# Patient Record
Sex: Male | Born: 1945 | Race: White | Hispanic: No | Smoking: Former smoker
Health system: Southern US, Community
[De-identification: ages and names within clinical notes are randomized; demographics above are authoritative.]

## PROBLEM LIST (undated history)

## (undated) DIAGNOSIS — T7840XA Allergy, unspecified, initial encounter: Secondary | ICD-10-CM

## (undated) DIAGNOSIS — I2699 Other pulmonary embolism without acute cor pulmonale: Secondary | ICD-10-CM

## (undated) DIAGNOSIS — I10 Essential (primary) hypertension: Secondary | ICD-10-CM

## (undated) DIAGNOSIS — G71039 Limb girdle muscular dystrophy, unspecified: Secondary | ICD-10-CM

## (undated) HISTORY — DX: Essential (primary) hypertension: I10

## (undated) HISTORY — DX: Limb girdle muscular dystrophy, unspecified: G71.039

## (undated) HISTORY — DX: Allergy, unspecified, initial encounter: T78.40XA

## (undated) HISTORY — DX: Other pulmonary embolism without acute cor pulmonale: I26.99

---

## 2019-11-12 ENCOUNTER — Ambulatory Visit: Payer: Medicare Other | Attending: Internal Medicine

## 2019-11-12 DIAGNOSIS — Z23 Encounter for immunization: Secondary | ICD-10-CM

## 2019-11-12 NOTE — Progress Notes (Signed)
   Covid-19 Vaccination Clinic  Name:  Mitchell Knapp    MRN: [id number] DOB: Oct 10, 1945  11/12/2019  Mr. [name] was observed post Covid-19 immunization for 15 minutes without incidence. He was provided with Vaccine Information Sheet and instruction to access the V-Safe system.   Mr. [name] was instructed to call 911 with any severe reactions post vaccine: Marland Kitchen Difficulty breathing  . Swelling of your face and throat  . A fast heartbeat  . A bad rash all over your body  . Dizziness and weakness    Immunizations Administered    Name Date Dose VIS Date Route   Pfizer COVID-19 Vaccine 11/12/2019  1:23 PM 0.3 mL 09/14/2019 Intramuscular   Manufacturer: [employer], [employer]   Lot: [id number]   NDC: [address]-7

## 2019-11-29 ENCOUNTER — Ambulatory Visit: Payer: Self-pay

## 2019-12-07 ENCOUNTER — Ambulatory Visit: Payer: Medicare Other | Attending: Internal Medicine

## 2019-12-07 DIAGNOSIS — Z23 Encounter for immunization: Secondary | ICD-10-CM | POA: Insufficient documentation

## 2019-12-07 NOTE — Progress Notes (Signed)
   Covid-19 Vaccination Clinic  Name:  Mitchell Knapp    MRN: [id number] DOB: 1946-08-30  12/07/2019  [name] was observed post Covid-19 immunization for 15 minutes without incident. He was provided with Vaccine Information Sheet and instruction to access the V-Safe system.   [name] was instructed to call 911 with any severe reactions post vaccine: Marland Kitchen Difficulty breathing  . Swelling of face and throat  . A fast heartbeat  . A bad rash all over body  . Dizziness and weakness   Immunizations Administered    Name Date Dose VIS Date Route   Pfizer COVID-19 Vaccine 12/07/2019  8:29 AM 0.3 mL 09/14/2019 Intramuscular   Manufacturer: [employer], [employer]   Lot: [id number]   NDC: [address]-7

## 2020-08-27 ENCOUNTER — Emergency Department (HOSPITAL_COMMUNITY): Payer: Medicare Other

## 2020-08-27 ENCOUNTER — Encounter (HOSPITAL_COMMUNITY): Payer: Self-pay | Admitting: Physician Assistant

## 2020-08-27 ENCOUNTER — Inpatient Hospital Stay (HOSPITAL_COMMUNITY)
Admission: EM | Admit: 2020-08-27 | Discharge: 2020-08-30 | DRG: 175 | Disposition: A | Discharge status: Home / Self Care | Payer: Medicare Other | Attending: Internal Medicine | Admitting: Internal Medicine

## 2020-08-27 ENCOUNTER — Other Ambulatory Visit: Payer: Self-pay

## 2020-08-27 DIAGNOSIS — Z87891 Personal history of nicotine dependence: Secondary | ICD-10-CM

## 2020-08-27 DIAGNOSIS — I959 Hypotension, unspecified: Secondary | ICD-10-CM | POA: Diagnosis present

## 2020-08-27 DIAGNOSIS — I2694 Multiple subsegmental pulmonary emboli without acute cor pulmonale: Secondary | ICD-10-CM

## 2020-08-27 DIAGNOSIS — I10 Essential (primary) hypertension: Secondary | ICD-10-CM

## 2020-08-27 DIAGNOSIS — Z20822 Contact with and (suspected) exposure to covid-19: Secondary | ICD-10-CM | POA: Diagnosis present

## 2020-08-27 DIAGNOSIS — N400 Enlarged prostate without lower urinary tract symptoms: Secondary | ICD-10-CM | POA: Diagnosis present

## 2020-08-27 DIAGNOSIS — E872 Acidosis: Secondary | ICD-10-CM | POA: Diagnosis present

## 2020-08-27 DIAGNOSIS — D72829 Elevated white blood cell count, unspecified: Secondary | ICD-10-CM | POA: Diagnosis present

## 2020-08-27 DIAGNOSIS — I2602 Saddle embolus of pulmonary artery with acute cor pulmonale: Secondary | ICD-10-CM | POA: Diagnosis not present

## 2020-08-27 DIAGNOSIS — I2601 Septic pulmonary embolism with acute cor pulmonale: Secondary | ICD-10-CM | POA: Diagnosis not present

## 2020-08-27 DIAGNOSIS — Z23 Encounter for immunization: Secondary | ICD-10-CM

## 2020-08-27 DIAGNOSIS — J9601 Acute respiratory failure with hypoxia: Secondary | ICD-10-CM | POA: Diagnosis present

## 2020-08-27 DIAGNOSIS — G7109 Other specified muscular dystrophies: Secondary | ICD-10-CM | POA: Diagnosis present

## 2020-08-27 DIAGNOSIS — F32A Depression, unspecified: Secondary | ICD-10-CM | POA: Diagnosis present

## 2020-08-27 DIAGNOSIS — I2699 Other pulmonary embolism without acute cor pulmonale: Secondary | ICD-10-CM | POA: Diagnosis not present

## 2020-08-27 DIAGNOSIS — E860 Dehydration: Secondary | ICD-10-CM | POA: Diagnosis present

## 2020-08-27 DIAGNOSIS — G71039 Limb girdle muscular dystrophy, unspecified: Secondary | ICD-10-CM

## 2020-08-27 DIAGNOSIS — E876 Hypokalemia: Secondary | ICD-10-CM | POA: Diagnosis present

## 2020-08-27 LAB — URINALYSIS, ROUTINE W REFLEX MICROSCOPIC
Bilirubin Urine: NEGATIVE
Glucose, UA: NEGATIVE mg/dL
Ketones, ur: 5 mg/dL — AB
Nitrite: NEGATIVE
Protein, ur: 30 mg/dL — AB
Specific Gravity, Urine: 1.015 (ref 1.005–1.030)
WBC, UA: >50 WBC/hpf — ABNORMAL HIGH (ref 0–5)
pH: 5 (ref 5.0–8.0)

## 2020-08-27 LAB — COMPREHENSIVE METABOLIC PANEL
ALT: 31 U/L (ref 0–44)
AST: 27 U/L (ref 15–41)
Albumin: 2.7 g/dL — ABNORMAL LOW (ref 3.5–5.0)
Alkaline Phosphatase: 48 U/L (ref 38–126)
Anion gap: 16 — ABNORMAL HIGH (ref 5–15)
BUN: 23 mg/dL (ref 8–23)
CO2: 24 mmol/L (ref 22–32)
Calcium: 8.7 mg/dL — ABNORMAL LOW (ref 8.9–10.3)
Chloride: 102 mmol/L (ref 98–111)
Creatinine, Ser: 0.84 mg/dL (ref 0.61–1.24)
GFR, Estimated: >60 mL/min (ref >60)
Glucose, Bld: 164 mg/dL — ABNORMAL HIGH (ref 70–99)
Potassium: 2.2 mmol/L — CL (ref 3.5–5.1)
Sodium: 142 mmol/L (ref 135–145)
Total Bilirubin: 2.2 mg/dL — ABNORMAL HIGH (ref 0.3–1.2)
Total Protein: 6.5 g/dL (ref 6.5–8.1)

## 2020-08-27 LAB — CBC WITH DIFFERENTIAL/PLATELET
Abs Immature Granulocytes: 0.44 10*3/uL — ABNORMAL HIGH (ref 0.00–0.07)
Basophils Absolute: 0.1 10*3/uL (ref 0.0–0.1)
Basophils Relative: 1 %
Eosinophils Absolute: 0 10*3/uL (ref 0.0–0.5)
Eosinophils Relative: 0 %
HCT: 41.9 % (ref 39.0–52.0)
Hemoglobin: 14.4 g/dL (ref 13.0–17.0)
Immature Granulocytes: 2 %
Lymphocytes Relative: 6 %
Lymphs Abs: 1.1 10*3/uL (ref 0.7–4.0)
MCH: 32.4 pg (ref 26.0–34.0)
MCHC: 34.4 g/dL (ref 30.0–36.0)
MCV: 94.4 fL (ref 80.0–100.0)
Monocytes Absolute: 1.1 10*3/uL — ABNORMAL HIGH (ref 0.1–1.0)
Monocytes Relative: 5 %
Neutro Abs: 17.4 10*3/uL — ABNORMAL HIGH (ref 1.7–7.7)
Neutrophils Relative %: 86 %
Platelets: 225 10*3/uL (ref 150–400)
RBC: 4.44 MIL/uL (ref 4.22–5.81)
RDW: 14.4 % (ref 11.5–15.5)
WBC: 20.2 10*3/uL — ABNORMAL HIGH (ref 4.0–10.5)
nRBC: 0 % (ref 0.0–0.2)

## 2020-08-27 LAB — CK: Total CK: 80 U/L (ref 49–397)

## 2020-08-27 LAB — MAGNESIUM: Magnesium: 1.6 mg/dL — ABNORMAL LOW (ref 1.7–2.4)

## 2020-08-27 LAB — LACTIC ACID, PLASMA
Lactic Acid, Venous: 2.4 mmol/L (ref 0.5–1.9)
Lactic Acid, Venous: 2.7 mmol/L (ref 0.5–1.9)
Lactic Acid, Venous: 1.6 mmol/L (ref 0.5–1.9)

## 2020-08-27 LAB — BASIC METABOLIC PANEL
Anion gap: 14 (ref 5–15)
BUN: 23 mg/dL (ref 8–23)
CO2: 23 mmol/L (ref 22–32)
Calcium: 8.5 mg/dL — ABNORMAL LOW (ref 8.9–10.3)
Chloride: 101 mmol/L (ref 98–111)
Creatinine, Ser: 0.66 mg/dL (ref 0.61–1.24)
GFR, Estimated: >60 mL/min (ref >60)
Glucose, Bld: 101 mg/dL — ABNORMAL HIGH (ref 70–99)
Potassium: 3.4 mmol/L — ABNORMAL LOW (ref 3.5–5.1)
Sodium: 138 mmol/L (ref 135–145)

## 2020-08-27 LAB — PROTIME-INR
INR: 1.2 (ref 0.8–1.2)
Prothrombin Time: 14.9 seconds (ref 11.4–15.2)

## 2020-08-27 LAB — BRAIN NATRIURETIC PEPTIDE: B Natriuretic Peptide: 799.5 pg/mL — ABNORMAL HIGH (ref 0.0–100.0)

## 2020-08-27 LAB — TROPONIN I (HIGH SENSITIVITY)
Troponin I (High Sensitivity): 228 ng/L (ref <18)
Troponin I (High Sensitivity): 180 ng/L (ref <18)

## 2020-08-27 LAB — GLUCOSE, CAPILLARY: Glucose-Capillary: 90 mg/dL (ref 70–99)

## 2020-08-27 LAB — RESP PANEL BY RT-PCR (FLU A&B, COVID) ARPGX2
Influenza A by PCR: NEGATIVE
Influenza B by PCR: NEGATIVE
SARS Coronavirus 2 by RT PCR: NEGATIVE

## 2020-08-27 LAB — PROCALCITONIN: Procalcitonin: <0.1 ng/mL

## 2020-08-27 LAB — APTT: aPTT: 30 seconds (ref 24–36)

## 2020-08-27 MED ORDER — IOHEXOL 350 MG/ML SOLN
75.0000 mL | Freq: Once | INTRAVENOUS | Status: AC | PRN
  Administered 2020-08-27: 75 mL via INTRAVENOUS

## 2020-08-27 MED ORDER — CHLORHEXIDINE GLUCONATE CLOTH 2 % EX PADS
6.0000 | MEDICATED_PAD | Freq: Every day | CUTANEOUS | Status: DC
  Administered 2020-08-28 – 2020-08-29 (×2): 6 via TOPICAL

## 2020-08-27 MED ORDER — POLYETHYLENE GLYCOL 3350 17 G PO PACK: 17.0000 g | PACK | Freq: Every day | ORAL | Status: DC | PRN

## 2020-08-27 MED ORDER — VANCOMYCIN HCL IN DEXTROSE 1-5 GM/200ML-% IV SOLN: 1000.0000 mg | Freq: Once | INTRAVENOUS | Status: DC

## 2020-08-27 MED ORDER — INFLUENZA VAC A&B SA ADJ QUAD 0.5 ML IM PRSY
0.5000 mL | PREFILLED_SYRINGE | INTRAMUSCULAR | Status: AC
  Administered 2020-08-28: 0.5 mL via INTRAMUSCULAR
  Filled 2020-08-27: qty 0.5

## 2020-08-27 MED ORDER — LACTATED RINGERS IV BOLUS (SEPSIS)
1000.0000 mL | Freq: Once | INTRAVENOUS | Status: AC
  Administered 2020-08-27: 1000 mL via INTRAVENOUS

## 2020-08-27 MED ORDER — MAGNESIUM SULFATE 2 GM/50ML IV SOLN
2.0000 g | Freq: Once | INTRAVENOUS | Status: AC
  Administered 2020-08-27: 2 g via INTRAVENOUS
  Filled 2020-08-27: qty 50

## 2020-08-27 MED ORDER — HEPARIN (PORCINE) 25000 UT/250ML-% IV SOLN
1600.0000 [IU]/h | INTRAVENOUS | Status: AC
  Administered 2020-08-27 – 2020-08-28 (×3): 1600 [IU]/h via INTRAVENOUS
  Filled 2020-08-27 (×3): qty 250

## 2020-08-27 MED ORDER — METRONIDAZOLE IN NACL 5-0.79 MG/ML-% IV SOLN
500.0000 mg | Freq: Once | INTRAVENOUS | Status: AC
  Administered 2020-08-27: 500 mg via INTRAVENOUS
  Filled 2020-08-27: qty 100

## 2020-08-27 MED ORDER — POTASSIUM CHLORIDE CRYS ER 20 MEQ PO TBCR
40.0000 meq | EXTENDED_RELEASE_TABLET | Freq: Once | ORAL | Status: AC
  Administered 2020-08-27: 40 meq via ORAL
  Filled 2020-08-27: qty 2

## 2020-08-27 MED ORDER — DOCUSATE SODIUM 100 MG PO CAPS: 100.0000 mg | ORAL_CAPSULE | Freq: Two times a day (BID) | ORAL | Status: DC | PRN

## 2020-08-27 MED ORDER — VANCOMYCIN HCL IN DEXTROSE 1-5 GM/200ML-% IV SOLN: 1000.0000 mg | Freq: Two times a day (BID) | INTRAVENOUS | Status: DC

## 2020-08-27 MED ORDER — HEPARIN BOLUS VIA INFUSION
5500.0000 [IU] | Freq: Once | INTRAVENOUS | Status: AC
  Administered 2020-08-27: 5500 [IU] via INTRAVENOUS
  Filled 2020-08-27: qty 5500

## 2020-08-27 MED ORDER — VANCOMYCIN HCL 2000 MG/400ML IV SOLN
2000.0000 mg | Freq: Once | INTRAVENOUS | Status: AC
  Administered 2020-08-27: 2000 mg via INTRAVENOUS
  Filled 2020-08-27: qty 400

## 2020-08-27 MED ORDER — SODIUM CHLORIDE 0.9 % IV SOLN
2.0000 g | Freq: Once | INTRAVENOUS | Status: AC
  Administered 2020-08-27: 2 g via INTRAVENOUS
  Filled 2020-08-27: qty 2

## 2020-08-27 MED ORDER — LACTATED RINGERS IV SOLN: INTRAVENOUS | Status: DC

## 2020-08-27 MED ORDER — POTASSIUM CHLORIDE 10 MEQ/100ML IV SOLN
10.0000 meq | INTRAVENOUS | Status: AC
  Administered 2020-08-27 – 2020-08-28 (×5): 10 meq via INTRAVENOUS
  Filled 2020-08-27 (×3): qty 100

## 2020-08-27 MED ORDER — SODIUM CHLORIDE 0.9 % IV BOLUS
1000.0000 mL | Freq: Once | INTRAVENOUS | Status: AC
  Administered 2020-08-27: 1000 mL via INTRAVENOUS

## 2020-08-27 NOTE — ED Notes (Signed)
Date and time results received: 08/27/20 1535 (use smartphrase ".now" to insert current time)  Test: la Critical Value: 2.4  Name of Provider Notified: fondaW  Orders Received? Or Actions Taken?:

## 2020-08-27 NOTE — Progress Notes (Signed)
Critical care attending attestation note:  Patient seen and examined and relevant ancillary tests reviewed.  I agree with the assessment and plan of care as outlined by Jule Ser, NP. This patient was seen as a shared visit.   Synopsis of assessment and plan:  74 year old man with limb girdle MD. Prolonged car trip.  Pre-syncope at home. Hypotension improved with fluids.  Now normotensive and not tachycardic  Still dyspnea when speaking.  On examination: mild respiratory distress. Chest clear. No JVD, no P2 no right sided gallop. No palpable cords, no calf tenderness.   Personally reviewed CT which showed significant central clot burden.  POC Echo confirms RV dilatation.  Assessment:   Submassive PE with RV dysfunction. No contraindications to lytics if shock develops Consider lytics if lactate fails to clear.    Lynnell Catalan, MD [facility] ICU Physician [facility]  Pager: [phone] Mobile: [phone] After hours: [phone].  08/27/2020, 6:58 PM

## 2020-08-27 NOTE — H&P (Signed)
NAME:  Mitchell Knapp, MRN:  [id number], DOB:  1946-04-10, LOS: 0 ADMISSION DATE:  08/27/2020, CONSULTATION DATE:  08/27/2020 REFERRING MD:  Solon Augusta, PA, CHIEF COMPLAINT:  SOB  Brief History   74 year old male with recent long travel presenting with progressive 1 week hx of SOB, acutely worsened this morning, and was near syncopal.  Initially hypotensive and hypoxic found to have acute PE with evidence of right heart strain.  Hypotension improved with IVF.  PCCM to admit to ICU overnight for close observation.    History of present illness   74 year old male with history of limb-girdle muscular dystrophy, HTN, anxiety and depression presenting with progressive one week of shortness of breath which became severe this morning and was near syncopal.   Reports he recently traveled from [place] and drove.  Since has felt weak and had progressive shortness of breath.  This morning, he states it became severe and was near syncopal.  Denies any lower leg pain/ swelling, fever, N/V, diarrhea, recent surgeries or trauma, or bleeding episodes (except for occasional blood streaks when constipated). Up todate on cancer screenings.   Found by EMS hypoxic 81% on room air, tachycardic, tachypneic, and hypotensive with SBP in the 70's.  In ER, he was afebrile. Labs noted for K 2.2, AG 16, Mag 1.6, BNP 799, troponin hs 228-> 180, CK 80, WBC 20.2, normal coags, negative SARS2/ flu, CXR with no acute process, borderline cardiomegaly.  He was empirically started on empiric abx for concern of sepsis.  CTA PE showed extensive bilateral Pes with evidence of right heart strain.   BP improved after 3L LR. He was started on heparin gtt and PCCM called for consult/ possible admit to ICU.   Past Medical History  Limb-girdle muscular dystrophy, HTN, depression, anxiety, former smoker  Significant Hospital Events   11/24 Admit  Consults:   Procedures:   Significant Diagnostic Tests:  11/24 CTA PE >> 1.  Positive for extensive bilateral pulmonary emboli with CT evidence of right heart strain. No saddle embolus at this time. 2. Minimal nonspecific upper lobe pulmonary ground-glass opacity, might be early infarction. Trace left pleural effusion. 3. Calcified coronary artery and Aortic Atherosclerosis  Micro Data:  11/24 SARS 2/ flu>> neg 11/24 BC >> 11/24 UC >>  Antimicrobials:  11/24 vanc  11/24 cefepime  11/24 flagyl  Interim history/subjective:  Remains on 4L [place]  Objective   Blood pressure 112/85, pulse 91, temperature 100 F (37.8 C), temperature source Rectal, resp. rate (!) 23, height 6\' 4"  (1.93 m), weight 99.8 kg, SpO2 100 %.       No intake or output data in the 24 hours ending 08/27/20 1828 Filed Weights   08/27/20 1415  Weight: 99.8 kg   Examination: Per Dr. 08/29/20  [facility] Problem list    Assessment & Plan:   Provoked submassive PE, extensive with evidence of right heart strain Hypoxic respiratory failure secondary to above- hypotensive and near syncopal today  P:  Admit to ICU for close monitoring Continue heparin gtt.  No contraindications for systemic lytics if decompensates, patient already consented TTE in am  Bilateral LE dopplers to assess for DVT Goal MAP > 65 Continue supplemental O2 for sat goal > 92% Trending lactic acid  troponin trend flat   Hypokalemia hypomagnesemia  P:  K replete ordered Mag 2gm now  BMET after K replete completed    Leukocytosis  P:  Check PCT  Follow culture data Trend WBC/  Fever curve  Hold on further empiric abx  Best practice (evaluated daily)   Diet: NPO Pain/Anxiety/Delirium protocol (if indicated): n/a VAP protocol (if indicated): n/a DVT prophylaxis: heparin gtt GI prophylaxis: n/a Glucose control: trend on bmet Mobility: PT/OT after 24- 48hrs on heparin  Code Status: Full, confirmed with patient  Disposition: admit to ICU   Labs   CBC: Recent Labs  Lab 08/27/20 1452  WBC  20.2*  NEUTROABS 17.4*  HGB 14.4  HCT 41.9  MCV 94.4  PLT 225    Basic Metabolic Panel: Recent Labs  Lab 08/27/20 1452 08/27/20 1645  NA 142  --   K 2.2*  --   CL 102  --   CO2 24  --   GLUCOSE 164*  --   BUN 23  --   CREATININE 0.84  --   CALCIUM 8.7*  --   MG  --  1.6*   GFR: Estimated Creatinine Clearance: 96.2 mL/min (by C-G formula based on SCr of 0.84 mg/dL). Recent Labs  Lab 08/27/20 1452  WBC 20.2*  LATICACIDVEN 2.4*    Liver Function Tests: Recent Labs  Lab 08/27/20 1452  AST 27  ALT 31  ALKPHOS 48  BILITOT 2.2*  PROT 6.5  ALBUMIN 2.7*   No results for input(s): LIPASE, AMYLASE in the last 168 hours. No results for input(s): AMMONIA in the last 168 hours.  ABG No results found for: PHART, PCO2ART, PO2ART, HCO3, TCO2, ACIDBASEDEF, O2SAT   Coagulation Profile: Recent Labs  Lab 08/27/20 1524  INR 1.2    Cardiac Enzymes: Recent Labs  Lab 08/27/20 1452  CKTOTAL 80    HbA1C: No results found for: HGBA1C  CBG: No results for input(s): GLUCAP in the last 168 hours.  Review of Systems:   Review of Systems  Constitutional: Negative for fever.  Respiratory: Positive for cough and shortness of breath. Negative for hemoptysis, sputum production and wheezing.   Cardiovascular: Negative for chest pain and leg swelling.  Gastrointestinal: Negative for blood in stool, constipation, diarrhea, nausea and vomiting.  Musculoskeletal: Negative for falls.  Neurological: Negative for focal weakness and loss of consciousness.   Past Medical History  He,  has no past medical history on file.   Surgical History   History reviewed. No pertinent surgical history.   Social History      Family History   His family history is not on file.   Allergies Allergies  Allergen Reactions  . Lisinopril Swelling     Home Medications  Prior to Admission medications   Medication Sig Start Date End Date Taking? Authorizing Provider  amLODipine (NORVASC)  5 MG tablet Take 5 mg by mouth daily. 05/29/20  Yes [provider]  buPROPion (WELLBUTRIN XL) 300 MG 24 hr tablet Take 300 mg by mouth daily. 08/03/20  Yes [provider]  COMBIGAN 0.2-0.5 % ophthalmic solution Place 1 drop into both eyes 2 (two) times daily.  07/11/20  Yes [provider]  Ibuprofen-Acetaminophen (ADVIL DUAL ACTION) 125-250 MG TABS Take 2-3 tablets by mouth every 8 (eight) hours as needed (headache, pain).   Yes [provider]  loratadine (CLARITIN) 10 MG tablet Take 10 mg by mouth daily as needed for allergies.   Yes [provider]  LORazepam (ATIVAN) 1 MG tablet Take 1-1.5 mg by mouth daily as needed for anxiety.  05/29/20  Yes [provider]  LUMIGAN 0.01 % SOLN Place 1 drop into both eyes at bedtime. 07/19/20  Yes [provider]  tamsulosin (FLOMAX) 0.4 MG CAPS capsule Take 0.8 mg by mouth daily. 03/28/20  Yes [provider]     Critical care time: 40 mins     Posey Boyer, ACNP [facility] 08/27/2020, 6:28 PM  See Amion for personal pager PCCM on call pager [phone]

## 2020-08-27 NOTE — ED Provider Notes (Addendum)
[facility] EMERGENCY DEPARTMENT Provider Note   CSN: [id number] Arrival date & time: 08/27/20  1357     History Chief Complaint  Patient presents with  . Fatigue    Mitchell Knapp is a 74 y.o. male.  HPI   Pt is a 74 y/o male with a h/o muscular dystrophy, HTN, who presents to the ED today for eval of shortness of breath that started a few days ago. Reports cough, nasal congestion. He has tried claritin which has improved symptoms somewhat. Denies chest pain. Denies leg pain/swelling, hemoptysis, recent surgery, hormone use, personal hx of cancer, or hx of DVT/PE.   States he slid out of bed about 5 days ago and was on the ground for 8 hours because he could not get up off the ground. Denies head trauma or injuries from this fall.   Pt states he just moved her from [place] 2 weeks ago. He drove here from there and did not stop over night on the way. The drive was 13 hours. Pt has been vaccinated against COVID.   History reviewed. No pertinent past medical history.  There are no problems to display for this patient.   History reviewed. No pertinent surgical history.     History reviewed. No pertinent family history.  Social History   Tobacco Use  . Smoking status: Not on file  Substance Use Topics  . Alcohol use: Not on file  . Drug use: Not on file    Home Medications Prior to Admission medications   Not on File    Allergies    Lisinopril  Review of Systems   Review of Systems  Constitutional: Positive for chills and fatigue. Negative for fever.  HENT: Positive for congestion. Negative for ear pain.   Eyes: Negative for pain and visual disturbance.  Respiratory: Positive for cough and shortness of breath.   Cardiovascular: Negative for chest pain and leg swelling.  Gastrointestinal: Negative for abdominal pain, constipation, diarrhea, nausea and vomiting.  Genitourinary: Negative for dysuria and hematuria.  Musculoskeletal:  Negative for back pain.  Skin: Negative for rash.  Neurological: Positive for weakness (generalized). Negative for headaches.  All other systems reviewed and are negative.   Physical Exam Updated Vital Signs BP 95/77   Pulse 92   Temp 100 F (37.8 C) (Rectal)   Resp 17   Ht 6\' 4"  (1.93 m)   Wt 99.8 kg   SpO2 96%   BMI 26.78 kg/m   Physical Exam Vitals and nursing note reviewed.  Constitutional:      Appearance: He is well-developed. He is ill-appearing.  HENT:     Head: Normocephalic and atraumatic.     Mouth/Throat:     Mouth: Mucous membranes are dry.  Eyes:     Conjunctiva/sclera: Conjunctivae normal.  Cardiovascular:     Rate and Rhythm: Normal rate and regular rhythm.     Heart sounds: Normal heart sounds. No murmur heard.   Pulmonary:     Effort: No respiratory distress.     Breath sounds: Rales (bibasilar) present. No wheezing or rhonchi.     Comments: Conversationally dyspneic Abdominal:     General: Bowel sounds are normal.     Palpations: Abdomen is soft.     Tenderness: There is no abdominal tenderness. There is no guarding or rebound.  Musculoskeletal:     Cervical back: Neck supple.  Skin:    General: Skin is warm and dry.  Neurological:     Mental  Status: He is alert.     ED Results / Procedures / Treatments   Labs (all labs ordered are listed, but only abnormal results are displayed) Labs Reviewed  CBC WITH DIFFERENTIAL/PLATELET - Abnormal; Notable for the following components:      Result Value   WBC 20.2 (*)    Neutro Abs 17.4 (*)    Monocytes Absolute 1.1 (*)    Abs Immature Granulocytes 0.44 (*)    All other components within normal limits  CULTURE, BLOOD (ROUTINE X 2)  CULTURE, BLOOD (ROUTINE X 2)  RESP PANEL BY RT-PCR (FLU A&B, COVID) ARPGX2  URINE CULTURE  COMPREHENSIVE METABOLIC PANEL  LACTIC ACID, PLASMA  URINALYSIS, ROUTINE W REFLEX MICROSCOPIC  CK  PROTIME-INR  APTT  TROPONIN I (HIGH SENSITIVITY)    EKG EKG  Interpretation  Date/Time:  Wednesday August 27 2020 14:09:49 EST Ventricular Rate:  102 PR Interval:    QRS Duration: 126 QT Interval:  361 QTC Calculation: 471 R Axis:   -63 Text Interpretation: Sinus tachycardia Left bundle branch block No acute changes Nonspecific ST and T wave abnormality Confirmed by Derwood Kaplan ([id number]) on 08/27/2020 2:49:03 PM   Radiology DG Chest Portable 1 View  Result Date: 08/27/2020 CLINICAL DATA:  74 year old male with cough. EXAM: PORTABLE CHEST 1 VIEW COMPARISON:  None. FINDINGS: Minimal left lung base atelectasis/scarring. No focal consolidation, pleural effusion or pneumothorax. Borderline cardiomegaly. Bilateral hilar prominence, likely related to pulmonary hypertension. There is mild atherosclerotic calcification of the aorta. No acute osseous pathology. IMPRESSION: 1. No acute cardiopulmonary process. 2. Borderline cardiomegaly. Electronically Signed   By: Elgie Collard M.D.   On: 08/27/2020 15:30    Procedures Procedures (including critical care time)  CRITICAL CARE Performed by: Karrie Meres   Total critical care time: 35 minutes  Critical care time was exclusive of separately billable procedures and treating other patients.  Critical care was necessary to treat or prevent imminent or life-threatening deterioration.  Critical care was time spent personally by me on the following activities: development of treatment plan with patient and/or surrogate as well as nursing, discussions with consultants, evaluation of patient's response to treatment, examination of patient, obtaining history from patient or surrogate, ordering and performing treatments and interventions, ordering and review of laboratory studies, ordering and review of radiographic studies, pulse oximetry and re-evaluation of patient's condition.   Medications Ordered in ED Medications  lactated ringers bolus 1,000 mL (has no administration in time range)    And    lactated ringers bolus 1,000 mL (has no administration in time range)  ceFEPIme (MAXIPIME) 2 g in sodium chloride 0.9 % 100 mL IVPB (has no administration in time range)  metroNIDAZOLE (FLAGYL) IVPB 500 mg (has no administration in time range)  vancomycin (VANCOREADY) IVPB 2000 mg/400 mL (has no administration in time range)  sodium chloride 0.9 % bolus 1,000 mL (1,000 mLs Intravenous New Bag/Given 08/27/20 1506)    ED Course  I have reviewed the triage vital signs and the nursing notes.  Pertinent labs & imaging results that were available during my care of the patient were reviewed by me and considered in my medical decision making (see chart for details).    MDM Rules/Calculators/A&P                          74 year old male presenting the emergency department today for evaluation of shortness of breath starting several days ago.  Just had a recent 13-hour  drive from [place] 2 weeks ago.  On arrival he is hypoxic and marginally tachycardic.  He is also intermittently hypotensive.  Have initiated work-up with labs, chest x-ray, CT PE protocol, EKG.  Will order IV fluids and obtain a rectal temperature.  Rectal temp was elevated and CBC resulted showing a white count of 20,000.   therefore code sepsis initiated with broad-spectrum antibiotics and the remainder of his 30 cc/kg fluid bolus.  At shift change, patient pending the remainder of his laboratory work and CTA chest. Care transitioned to [facility], PA-C. Plan is to f/u on pending w/u and admit patient for further treatment.    Final Clinical Impression(s) / ED Diagnoses Final diagnoses:  Acute respiratory failure with hypoxia [facility])    Rx / DC Orders ED Discharge Orders    None       Karrie Meres, PA-C 08/27/20 1502    Hlee Fringer S, PA-C 08/27/20 1531    Lalonnie Shaffer S, PA-C 08/27/20 1538    Derwood Kaplan, MD 09/05/20 1511

## 2020-08-27 NOTE — Sepsis Progress Note (Signed)
Notified bedside nurse of need to draw repeat lactic acid after completing fluid resuscitation.Marland Kitchen

## 2020-08-27 NOTE — Progress Notes (Signed)
ANTICOAGULATION CONSULT NOTE - Initial Consult  Pharmacy Consult for Heparin Indication: pulmonary embolus  Allergies  Allergen Reactions  . Lisinopril Swelling    Patient Measurements: Height: 6\' 4"  (193 cm) Weight: 99.8 kg (220 lb) IBW/kg (Calculated) : 86.8 Heparin Dosing Weight: 99.8 kg  Vital Signs: Temp: 100 F (37.8 C) (11/24 1500) Temp Source: Rectal (11/24 1500) BP: 103/86 (11/24 1615) Pulse Rate: 91 (11/24 1615)  Labs: Recent Labs    08/27/20 1452 08/27/20 1524  HGB 14.4  --   HCT 41.9  --   PLT 225  --   APTT  --  30  LABPROT  --  14.9  INR  --  1.2  CREATININE 0.84  --   CKTOTAL 80  --   TROPONINIHS 228*  --     Estimated Creatinine Clearance: 96.2 mL/min (by C-G formula based on SCr of 0.84 mg/dL).   Medical History: History reviewed. No pertinent past medical history.  Medications:  Infusions:  . heparin    . lactated ringers 1,000 mL (08/27/20 1650)  . metronidazole    . potassium chloride    . [START ON 08/28/2020] vancomycin    . vancomycin 2,000 mg (08/27/20 1649)    Assessment: 74 y.o. male admitted with fatigue found to have PE. Pharmacy has been consulted to start heparin. Hemoglobin and platelets wnl.    Goal of Therapy:  Heparin level 0.3-0.7 units/ml Monitor platelets by anticoagulation protocol: Yes   Plan:  Give 5500 units bolus x 1 Start heparin infusion at 1600 units/hr Check heparin level in 8 hours and daily while on heparin Continue to monitor H&H and platelets  66, PharmD PGY1 Pharmacy Resident 08/27/2020 4:55 PM  Please check [email or web address] for unit-specific pharmacy phone numbers.

## 2020-08-27 NOTE — Plan of Care (Signed)

## 2020-08-27 NOTE — Progress Notes (Signed)
eLink Physician-Brief Progress Note Patient Name: Mitchell Knapp DOB: July 16, 1946 MRN: [id number]   Date of Service  08/27/2020  HPI/Events of Note  78 F limm girdle muscle dystrophy, hypertension, anxiety and depression presented with shortness of breath and near syncopal episodes. Found to have bilateral PE with right heart strain  eICU Interventions  Submassive PE on heparin drip Plan for fibrinolysis if deteriorates     Intervention Category Evaluation Type: New Patient Evaluation  Darl Pikes 08/27/2020, 10:59 PM

## 2020-08-27 NOTE — Progress Notes (Signed)
Pharmacy Antibiotic Note  Mitchell Knapp is a 74 y.o. male admitted on 08/27/2020 with sepsis.  Pharmacy has been consulted for cefepime and vancomycin dosing.  Plan: Cefepime 2g IV q8h Vancomycin 2000 mg IV x1 followed by 1000 mg IV q12h Trough goal 15-20 mcg/mL Monitor renal function, cultures, clinical progress  Height: 6\' 4"  (193 cm) Weight: 99.8 kg (220 lb) IBW/kg (Calculated) : 86.8  Temp (24hrs), Avg:98.5 F (36.9 C), Min:97.6 F (36.4 C), Max:100 F (37.8 C)  Recent Labs  Lab 08/27/20 1452  WBC 20.2*    CrCl cannot be calculated (No successful lab value found.).    Allergies  Allergen Reactions  . Lisinopril Swelling    Antimicrobials this admission: Cefepime 11/24 >>  Vancomycin 11/24 >>  Flagyl 11/24 >>  Dose adjustments this admission:   Microbiology results: 11/24 BCx: sent   Thank you for allowing pharmacy to be a part of this patient's care.  12/24, PharmD PGY1 Pharmacy Resident 08/27/2020 3:33 PM  Please check [email or web address] for unit-specific pharmacy phone numbers.

## 2020-08-27 NOTE — ED Provider Notes (Signed)
Accepted handoff at shift change from [facility]. PA-C. Please see prior provider note for more detail.   Briefly: Patient is 74 y.o. male with history of limb-girdle muscular dystrophy denies any other significant past medical history apart from hypertension, depression, allergies and anxiety   DDX: concern for sepsis versus PE  Plan: Follow-up on blood work and CT scan and disposition appropriately suspect patient will be admission.  Physical Exam  BP 112/85   Pulse 91   Temp 100 F (37.8 C) (Rectal)   Resp (!) 23   Ht 6\' 4"  (1.93 m)   Wt 99.8 kg   SpO2 100%   BMI 26.78 kg/m   CONSTITUTIONAL:  fatigued, ill appearing 74 year old male NEURO:  Alert and oriented x 3, no focal deficits EYES:  pupils equal and reactive ENT/NECK:  trachea midline, no significant JVD CARDIO:  reg rate, reg rhythm, well-perfused PULM:  None labored breathing, clear to auscultation all fields. GI/GU:  Abdomen non-distended, soft, no tenderness to palpation. MSK/SPINE:  No gross deformities, no edema SKIN:  no rash obvious, atraumatic, no ecchymosis  PSYCH:  Appropriate speech and behavior  ED Course/Procedures     Results for orders placed or performed during the hospital encounter of 08/27/20  Resp Panel by RT-PCR (Flu A&B, Covid) Nasopharyngeal Swab   Specimen: Nasopharyngeal Swab; Nasopharyngeal(NP) swabs in vial transport medium  Result Value Ref Range   SARS Coronavirus 2 by RT PCR NEGATIVE NEGATIVE   Influenza A by PCR NEGATIVE NEGATIVE   Influenza B by PCR NEGATIVE NEGATIVE  CBC with Differential  Result Value Ref Range   WBC 20.2 (H) 4.0 - 10.5 K/uL   RBC 4.44 4.22 - 5.81 MIL/uL   Hemoglobin 14.4 13.0 - 17.0 g/dL   HCT 08/29/20 39 - 52 %   MCV 94.4 80.0 - 100.0 fL   MCH 32.4 26.0 - 34.0 pg   MCHC 34.4 30.0 - 36.0 g/dL   RDW 70.7 86.7 - 54.4 %   Platelets 225 150 - 400 K/uL   nRBC 0.0 0.0 - 0.2 %   Neutrophils Relative % 86 %   Neutro Abs 17.4 (H) 1.7 - 7.7 K/uL   Lymphocytes Relative 6  %   Lymphs Abs 1.1 0.7 - 4.0 K/uL   Monocytes Relative 5 %   Monocytes Absolute 1.1 (H) 0.1 - 1.0 K/uL   Eosinophils Relative 0 %   Eosinophils Absolute 0.0 0.0 - 0.5 K/uL   Basophils Relative 1 %   Basophils Absolute 0.1 0.0 - 0.1 K/uL   Immature Granulocytes 2 %   Abs Immature Granulocytes 0.44 (H) 0.00 - 0.07 K/uL  Comprehensive metabolic panel  Result Value Ref Range   Sodium 142 135 - 145 mmol/L   Potassium 2.2 (LL) 3.5 - 5.1 mmol/L   Chloride 102 98 - 111 mmol/L   CO2 24 22 - 32 mmol/L   Glucose, Bld 164 (H) 70 - 99 mg/dL   BUN 23 8 - 23 mg/dL   Creatinine, Ser 92.0 0.61 - 1.24 mg/dL   Calcium 8.7 (L) 8.9 - 10.3 mg/dL   Total Protein 6.5 6.5 - 8.1 g/dL   Albumin 2.7 (L) 3.5 - 5.0 g/dL   AST 27 15 - 41 U/L   ALT 31 0 - 44 U/L   Alkaline Phosphatase 48 38 - 126 U/L   Total Bilirubin 2.2 (H) 0.3 - 1.2 mg/dL   GFR, Estimated 1.00 >71 mL/min   Anion gap 16 (H) 5 - 15  Lactic acid, plasma  Result Value Ref Range   Lactic Acid, Venous 2.4 (HH) 0.5 - 1.9 mmol/L  Urinalysis, Routine w reflex microscopic  Result Value Ref Range   Color, Urine AMBER (A) YELLOW   APPearance HAZY (A) CLEAR   Specific Gravity, Urine 1.015 1.005 - 1.030   pH 5.0 5.0 - 8.0   Glucose, UA NEGATIVE NEGATIVE mg/dL   Hgb urine dipstick SMALL (A) NEGATIVE   Bilirubin Urine NEGATIVE NEGATIVE   Ketones, ur 5 (A) NEGATIVE mg/dL   Protein, ur 30 (A) NEGATIVE mg/dL   Nitrite NEGATIVE NEGATIVE   Leukocytes,Ua LARGE (A) NEGATIVE   RBC / HPF 6-10 0 - 5 RBC/hpf   WBC, UA >50 (H) 0 - 5 WBC/hpf   Bacteria, UA RARE (A) NONE SEEN   Squamous Epithelial / LPF 0-5 0 - 5   Mucus PRESENT    Non Squamous Epithelial 0-5 (A) NONE SEEN  CK  Result Value Ref Range   Total CK 80 49.0 - 397.0 U/L  Protime-INR  Result Value Ref Range   Prothrombin Time 14.9 11.4 - 15.2 seconds   INR 1.2 0.8 - 1.2  APTT  Result Value Ref Range   aPTT 30 24 - 36 seconds  Brain natriuretic peptide  Result Value Ref Range   B  Natriuretic Peptide 799.5 (H) 0.0 - 100.0 pg/mL  Magnesium  Result Value Ref Range   Magnesium 1.6 (L) 1.7 - 2.4 mg/dL  Lactic acid, plasma  Result Value Ref Range   Lactic Acid, Venous 2.7 (HH) 0.5 - 1.9 mmol/L  Troponin I (High Sensitivity)  Result Value Ref Range   Troponin I (High Sensitivity) 228 (HH) <18 ng/L  Troponin I (High Sensitivity)  Result Value Ref Range   Troponin I (High Sensitivity) 180 (HH) <18 ng/L   CT Angio Chest PE W and/or Wo Contrast  Result Date: 08/27/2020 CLINICAL DATA:  74 year old male with shortness of breath. EXAM: CT ANGIOGRAPHY CHEST WITH CONTRAST TECHNIQUE: Multidetector CT imaging of the chest was performed using the standard protocol during bolus administration of intravenous contrast. Multiplanar CT image reconstructions and MIPs were obtained to evaluate the vascular anatomy. CONTRAST:  89mL OMNIPAQUE IOHEXOL 350 MG/ML SOLN COMPARISON:  Portable chest 1507 hours today. FINDINGS: Cardiovascular: Adequate contrast bolus timing in the pulmonary arterial tree. Extensive bilateral pulmonary artery filling defects. No saddle embolus, but evidence of some contrast stasis in the right main pulmonary artery with unusual mixing artifact there (series 6, image 125). Extensive right upper, right middle and right lower lobe thrombus. Less pronounced left upper lobe, lingula thrombus. Fairly occlusive left lower lobe thrombus. RV / LV ratio is abnormal along with straightening of the interventricular septum (series 6, image 195). Superimposed calcified coronary artery atherosclerosis. Little contrast in the aorta. Mild Calcified aortic atherosclerosis. No cardiomegaly or pericardial effusion. Mediastinum/Nodes: Negative. No mediastinal lymphadenopathy. There is a small volume of oral contrast in the esophagus which appears unremarkable. Lungs/Pleura: Only subtle small areas of upper lobe ground-glass opacity are identified including peripherally in the right lung apex  (series 7, image 13, but also peribronchial in the left upper lobe (image 21). No consolidation. Trace layering left pleural fluid only. No right effusion. Upper Abdomen: Some contrast reflux into the hepatic IVC and hepatic veins. Otherwise negative visible liver, gallbladder, spleen, pancreas, left adrenal gland and bowel in the upper abdomen. Trace oral contrast in the stomach. Musculoskeletal: No acute osseous abnormality identified. Mild scoliosis. Review of the MIP images confirms the above  findings. IMPRESSION: 1. Positive for extensive bilateral pulmonary emboli with CT evidence of right heart strain. No saddle embolus at this time. 2. Minimal nonspecific upper lobe pulmonary ground-glass opacity, might be early infarction. Trace left pleural effusion. 3. Calcified coronary artery and Aortic Atherosclerosis (ICD10-I70.0). Critical Value/emergent results were called by telephone at the time of interpretation on 08/27/2020 at 4:48 pm to Dr Gwyneth Sprout in the ED who verbally acknowledged these results. Electronically Signed   By: Odessa Fleming M.D.   On: 08/27/2020 16:47   DG Chest Portable 1 View  Result Date: 08/27/2020 CLINICAL DATA:  74 year old male with cough. EXAM: PORTABLE CHEST 1 VIEW COMPARISON:  None. FINDINGS: Minimal left lung base atelectasis/scarring. No focal consolidation, pleural effusion or pneumothorax. Borderline cardiomegaly. Bilateral hilar prominence, likely related to pulmonary hypertension. There is mild atherosclerotic calcification of the aorta. No acute osseous pathology. IMPRESSION: 1. No acute cardiopulmonary process. 2. Borderline cardiomegaly. Electronically Signed   By: Elgie Collard M.D.   On: 08/27/2020 15:30    .Critical Care Performed by: Gailen Shelter, PA Authorized by: Gailen Shelter, PA   Critical care provider statement:    Critical care time (minutes):  45   Critical care time was exclusive of:  Separately billable procedures and treating other  patients and teaching time   Critical care was time spent personally by me on the following activities:  Discussions with consultants, evaluation of patient's response to treatment, examination of patient, ordering and performing treatments and interventions, ordering and review of laboratory studies, ordering and review of radiographic studies, pulse oximetry, re-evaluation of patient's condition, obtaining history from patient or surrogate and review of old charts   I assumed direction of critical care for this patient from another provider in my specialty: no      MDM   Patient with bilateral pulmonary embolisms. Right heart strain.  He does have elevated troponin which is consistent with pulmonary embolism.  Mild lactic acidosis patient does appear significantly dehydrated was mildly hypotensive at presentation however responded well to IV fluids.  Was initiated as a code sepsis due to elevated WBC  however this may be secondary to physiologic stress.  CBC without any other significant abnormalities.  CMP with hypokalemia 2.2 magnesium obtained and pending.  No other significant electrolyte abnormalities.  Anion gap 16 likely secondary to lactic acidosis.  Patient initiated on heparin.  He states he is pain-free currently.  Blood pressure is within normal limits  He is received fluids.  Is receiving potassium.  Has already received empiric antibiotics by prior provider.  Urinalysis does not appear infected patient is covered by antibiotics therefore no further intervention necessary at this time.  I discussed this case with my attending physician who cosigned this note including patient's presenting symptoms, physical exam, and planned diagnostics and interventions. Attending physician stated agreement with plan or made changes to plan which were implemented.   Attending physician assessed patient at bedside.  Informed patient of his diagnoses.  He is understanding of need for hospital stay.   Will admit to medicine on heparin.  6:01 pm  Discussed with medicine who recommended critical care given troponin elevation, BNP elevation and lactic  Critical care assessed patient at bedside and treatment ICU is reasonable for admission.    Solon Augusta [place], [place] 08/27/20 1842    Gwyneth Sprout, MD 08/29/20 2207

## 2020-08-27 NOTE — ED Triage Notes (Addendum)
Pt presents from private residence w/ c/o of 1 week progressive weakness, malaise, and SOB. Pt with Hx of muscular dystrophy.   Per EMS pt with systolic BOP of 74 on scene.  On arrival, pt with SpO2 of 88% RA. O2 applied

## 2020-08-28 ENCOUNTER — Encounter (HOSPITAL_COMMUNITY): Payer: Self-pay | Admitting: Pulmonary Disease

## 2020-08-28 ENCOUNTER — Inpatient Hospital Stay (HOSPITAL_COMMUNITY): Payer: Medicare Other

## 2020-08-28 ENCOUNTER — Other Ambulatory Visit (HOSPITAL_COMMUNITY): Payer: Medicare Other

## 2020-08-28 ENCOUNTER — Other Ambulatory Visit: Payer: Self-pay

## 2020-08-28 DIAGNOSIS — I2601 Septic pulmonary embolism with acute cor pulmonale: Secondary | ICD-10-CM

## 2020-08-28 DIAGNOSIS — I2602 Saddle embolus of pulmonary artery with acute cor pulmonale: Secondary | ICD-10-CM | POA: Diagnosis not present

## 2020-08-28 LAB — ECHOCARDIOGRAM COMPLETE
Area-P 1/2: 3.45 cm2
Height: 76 in
S' Lateral: 2.4 cm
Single Plane A4C EF: 50.6 %
Weight: 3580.27 oz

## 2020-08-28 LAB — BASIC METABOLIC PANEL
Anion gap: 14 (ref 5–15)
BUN: 23 mg/dL (ref 8–23)
CO2: 23 mmol/L (ref 22–32)
Calcium: 8.6 mg/dL — ABNORMAL LOW (ref 8.9–10.3)
Chloride: 106 mmol/L (ref 98–111)
Creatinine, Ser: 0.79 mg/dL (ref 0.61–1.24)
GFR, Estimated: >60 mL/min (ref >60)
Glucose, Bld: 98 mg/dL (ref 70–99)
Potassium: 3 mmol/L — ABNORMAL LOW (ref 3.5–5.1)
Sodium: 143 mmol/L (ref 135–145)

## 2020-08-28 LAB — CBC
HCT: 41.2 % (ref 39.0–52.0)
Hemoglobin: 14.1 g/dL (ref 13.0–17.0)
MCH: 32.6 pg (ref 26.0–34.0)
MCHC: 34.2 g/dL (ref 30.0–36.0)
MCV: 95.2 fL (ref 80.0–100.0)
Platelets: 191 10*3/uL (ref 150–400)
RBC: 4.33 MIL/uL (ref 4.22–5.81)
RDW: 14.6 % (ref 11.5–15.5)
WBC: 18.6 10*3/uL — ABNORMAL HIGH (ref 4.0–10.5)
nRBC: 0 % (ref 0.0–0.2)

## 2020-08-28 LAB — HEPARIN LEVEL (UNFRACTIONATED)
Heparin Unfractionated: 0.55 IU/mL (ref 0.30–0.70)
Heparin Unfractionated: 0.48 IU/mL (ref 0.30–0.70)

## 2020-08-28 LAB — MRSA PCR SCREENING: MRSA by PCR: NEGATIVE

## 2020-08-28 MED ORDER — APIXABAN 5 MG PO TABS: 5.0000 mg | ORAL_TABLET | Freq: Two times a day (BID) | ORAL | Status: DC

## 2020-08-28 MED ORDER — AMLODIPINE BESYLATE 10 MG PO TABS: 10.0000 mg | ORAL_TABLET | Freq: Every day | ORAL | Status: DC

## 2020-08-28 MED ORDER — LATANOPROST 0.005 % OP SOLN
1.0000 [drp] | Freq: Every day | OPHTHALMIC | Status: DC
  Filled 2020-08-28: qty 2.5

## 2020-08-28 MED ORDER — AMLODIPINE BESYLATE 5 MG PO TABS
5.0000 mg | ORAL_TABLET | Freq: Every day | ORAL | Status: DC
  Administered 2020-08-28 – 2020-08-30 (×3): 5 mg via ORAL
  Filled 2020-08-28 (×3): qty 1

## 2020-08-28 MED ORDER — APIXABAN 5 MG PO TABS
10.0000 mg | ORAL_TABLET | Freq: Two times a day (BID) | ORAL | Status: DC
  Administered 2020-08-29 – 2020-08-30 (×3): 10 mg via ORAL
  Filled 2020-08-28 (×4): qty 2

## 2020-08-28 MED ORDER — POTASSIUM CHLORIDE CRYS ER 20 MEQ PO TBCR
20.0000 meq | EXTENDED_RELEASE_TABLET | ORAL | Status: AC
  Administered 2020-08-28 (×2): 20 meq via ORAL
  Filled 2020-08-28 (×2): qty 1

## 2020-08-28 MED ORDER — BUPROPION HCL ER (XL) 150 MG PO TB24
300.0000 mg | ORAL_TABLET | Freq: Every day | ORAL | Status: DC
  Administered 2020-08-28 – 2020-08-30 (×3): 300 mg via ORAL
  Filled 2020-08-28: qty 2
  Filled 2020-08-28: qty 1
  Filled 2020-08-28: qty 2

## 2020-08-28 MED ORDER — LORAZEPAM 1 MG PO TABS
1.0000 mg | ORAL_TABLET | Freq: Every day | ORAL | Status: DC | PRN
  Administered 2020-08-29 (×2): 1 mg via ORAL
  Filled 2020-08-28 (×2): qty 1

## 2020-08-28 MED ORDER — ACETAMINOPHEN 325 MG PO TABS
650.0000 mg | ORAL_TABLET | Freq: Four times a day (QID) | ORAL | Status: DC | PRN
  Administered 2020-08-28 – 2020-08-29 (×5): 650 mg via ORAL
  Filled 2020-08-28 (×5): qty 2

## 2020-08-28 MED ORDER — ORAL CARE MOUTH RINSE
15.0000 mL | Freq: Two times a day (BID) | OROMUCOSAL | Status: DC
  Administered 2020-08-29 – 2020-08-30 (×2): 15 mL via OROMUCOSAL

## 2020-08-28 MED ORDER — POTASSIUM CHLORIDE 10 MEQ/100ML IV SOLN
10.0000 meq | INTRAVENOUS | Status: AC
  Administered 2020-08-28 (×4): 10 meq via INTRAVENOUS
  Filled 2020-08-28 (×4): qty 100

## 2020-08-28 MED ORDER — ACETAMINOPHEN 325 MG PO TABS
650.0000 mg | ORAL_TABLET | Freq: Once | ORAL | Status: AC
  Administered 2020-08-28: 650 mg via ORAL
  Filled 2020-08-28: qty 2

## 2020-08-28 NOTE — Progress Notes (Signed)
ANTICOAGULATION CONSULT NOTE - Follow Up Consult  Pharmacy Consult for IV Heparin Indication: pulmonary embolus  Allergies  Allergen Reactions  . Lisinopril Swelling    Patient Measurements: Height: 6\' 4"  (193 cm) Weight: 101.5 kg (223 lb 12.3 oz) IBW/kg (Calculated) : 86.8 Heparin Dosing Weight: 99.8 kg  Vital Signs: Temp: 98 F (36.7 C) (11/25 0821) Temp Source: Oral (11/25 0821) BP: 135/91 (11/25 0600) Pulse Rate: 88 (11/25 0620)  Labs: Recent Labs    08/27/20 1452 08/27/20 1524 08/27/20 1645 08/27/20 2310 08/28/20 0056 08/28/20 0431 08/28/20 0841  HGB 14.4  --   --   --  14.1  --   --   HCT 41.9  --   --   --  41.2  --   --   PLT 225  --   --   --  191  --   --   APTT  --  30  --   --   --   --   --   LABPROT  --  14.9  --   --   --   --   --   INR  --  1.2  --   --   --   --   --   HEPARINUNFRC  --   --   --   --  0.55  --  0.48  CREATININE 0.84  --   --  0.66  --  0.79  --   CKTOTAL 80  --   --   --   --   --   --   TROPONINIHS 228*  --  180*  --   --   --   --     Estimated Creatinine Clearance: 101 mL/min (by C-G formula based on SCr of 0.79 mg/dL).   Medications:  Infusions:  . heparin 1,600 Units/hr (08/28/20 08/30/20)  . potassium chloride 10 mEq (08/28/20 0850)    Assessment: 74 year old male on IV Heparin for pulmonary embolism.  Heparin level is therapeutic x2.  No bleeding noted.   Goal of Therapy:  Heparin level 0.3-0.7 units/ml Monitor platelets by anticoagulation protocol: Yes   Plan:  Continue IV Heparin at 1600 units/hr.  Daily Heparin level and CBC while on therapy.  Follow-up interventional and anticoagulation plans.   66, PharmD, BCPS, BCCCP Clinical Pharmacist Please refer to [facility] for [facility] numbers 08/28/2020,9:43 AM

## 2020-08-28 NOTE — Progress Notes (Signed)
eLink Physician-Brief Progress Note Patient Name: Mitchell Knapp DOB: 01/30/1946 MRN: [id number]   Date of Service  08/28/2020  HPI/Events of Note  AM BMP requested  eICU Interventions  Ordered     Intervention Category Minor Interventions: Clinical assessment - ordering diagnostic tests  Oretha Milch 08/28/2020, 1:29 AM

## 2020-08-28 NOTE — Progress Notes (Signed)
  Echocardiogram 2D Echocardiogram has been performed.  Mitchell Knapp F 08/28/2020, 3:29 PM

## 2020-08-28 NOTE — H&P (Addendum)
This is a progress note     NAME:  Mitchell Knapp, MRN:  [id number], DOB:  [birth year], LOS: 1 ADMISSION DATE:  08/27/2020, CONSULTATION DATE:  08/27/2020 REFERRING MD:  Solon Augusta, PA, CHIEF COMPLAINT:  SOB  Brief History   74 year old male with recent long travel presenting with progressive 1 week hx of SOB, acutely worsened this morning, and was near syncopal.  Initially hypotensive and hypoxic found to have acute PE with evidence of right heart strain.  Hypotension improved with IVF.  PCCM to admit to ICU overnight for close observation.    History of present illness   74 year old male with history of limb-girdle muscular dystrophy, HTN, anxiety and depression presenting with progressive one week of shortness of breath which became severe this morning and was near syncopal.   Reports he recently traveled from [place] and drove.  Since has felt weak and had progressive shortness of breath.  This morning, he states it became severe and was near syncopal.  Denies any lower leg pain/ swelling, fever, N/V, diarrhea, recent surgeries or trauma, or bleeding episodes (except for occasional blood streaks when constipated). Up todate on cancer screenings.   Found by EMS hypoxic 81% on room air, tachycardic, tachypneic, and hypotensive with SBP in the 70's.  In ER, he was afebrile. Labs noted for K 2.2, AG 16, Mag 1.6, BNP 799, troponin hs 228-> 180, CK 80, WBC 20.2, normal coags, negative SARS2/ flu, CXR with no acute process, borderline cardiomegaly.  He was empirically started on empiric abx for concern of sepsis.  CTA PE showed extensive bilateral Pes with evidence of right heart strain.   BP improved after 3L LR. He was started on heparin gtt and PCCM called for consult/ possible admit to ICU.   Past Medical History  Limb-girdle muscular dystrophy, HTN, depression, anxiety, former smoker  Significant Hospital Events   11/24 Admit  Consults:   Procedures:   Significant Diagnostic  Tests:  11/24 CTA PE >> 1. Positive for extensive bilateral pulmonary emboli with CT evidence of right heart strain. No saddle embolus at this time. 2. Minimal nonspecific upper lobe pulmonary ground-glass opacity, might be early infarction. Trace left pleural effusion. 3. Calcified coronary artery and Aortic Atherosclerosis  Micro Data:  11/24 SARS 2/ flu>> neg 11/24 BC >> 11/24 UC >>  Antimicrobials:  11/24 vanc  11/24 cefepime  11/24 flagyl  Interim history/subjective:  Feeling better.  No lightheadedness, O2 sats improved.  Denies chest pain or hemoptysis.  Objective   Blood pressure (!) 130/101, pulse 83, temperature 98 F (36.7 C), temperature source Oral, resp. rate (!) 21, height 6\' 4"  (1.93 m), weight 101.5 kg, SpO2 96 %.        Intake/Output Summary (Last 24 hours) at 08/28/2020 1017 Last data filed at 08/28/2020 0845 Gross per 24 hour  Intake 220.19 ml  Output 450 ml  Net -229.81 ml   Filed Weights   08/27/20 1415 08/28/20 0500  Weight: 99.8 kg 101.5 kg   Examination: Elderly man in NAD Pupils equal, tracking, reactive to light MMM, trachea midline LE with chronic maculopapular rash, muscle wasting Globally weak, no focal deficits Heart sounds regular, ext warm Good insight, AOx3 No peripheral edema  Labs look okay  Resolved Hospital Problem list    Assessment & Plan:  Provoked submassive PE- seems to have settled out with heparin.  No indications for thrombolytics at present. - Heparin to stop at 2AM - Start eliquis tomorrow after  24h heparin, usual 7 day intro dose followed by 5mg  BID - Okay to start mobility as able, would ask PT to see - Would do at least 6 months AC with further use after discussion with PCP (if he has poor mobility at baseline taking ASA 325 + a PPI would be reasonable from my standpoint) - Stable for floor, appreciate [facility] taking over care, remaining issues are weaning oxygen and assuring   HTN, depression- restart home  meds  MD PCCM

## 2020-08-28 NOTE — Progress Notes (Signed)
ANTICOAGULATION CONSULT NOTE - Follow Up Consult  Pharmacy Consult for heparin Indication: pulmonary embolus  Labs: Recent Labs    08/27/20 1452 08/27/20 1524 08/27/20 1645 08/27/20 2310 08/28/20 0056  HGB 14.4  --   --   --   --   HCT 41.9  --   --   --   --   PLT 225  --   --   --   --   APTT  --  30  --   --   --   LABPROT  --  14.9  --   --   --   INR  --  1.2  --   --   --   HEPARINUNFRC  --   --   --   --  0.55  CREATININE 0.84  --   --  0.66  --   CKTOTAL 80  --   --   --   --   TROPONINIHS 228*  --  180*  --   --     Assessment/Plan:  74yo male therapeutic on heparin with initial dosing for PE. Will continue gtt at current rate and confirm stable with additional level.   Vernard Gambles, PharmD, BCPS  08/28/2020,1:32 AM

## 2020-08-29 ENCOUNTER — Inpatient Hospital Stay (HOSPITAL_COMMUNITY): Payer: Medicare Other

## 2020-08-29 DIAGNOSIS — I2694 Multiple subsegmental pulmonary emboli without acute cor pulmonale: Secondary | ICD-10-CM

## 2020-08-29 DIAGNOSIS — I2699 Other pulmonary embolism without acute cor pulmonale: Secondary | ICD-10-CM

## 2020-08-29 LAB — URINE CULTURE: Culture: NO GROWTH

## 2020-08-29 LAB — BASIC METABOLIC PANEL
Anion gap: 13 (ref 5–15)
BUN: 22 mg/dL (ref 8–23)
CO2: 22 mmol/L (ref 22–32)
Calcium: 8.2 mg/dL — ABNORMAL LOW (ref 8.9–10.3)
Chloride: 105 mmol/L (ref 98–111)
Creatinine, Ser: 0.74 mg/dL (ref 0.61–1.24)
GFR, Estimated: >60 mL/min (ref >60)
Glucose, Bld: 113 mg/dL — ABNORMAL HIGH (ref 70–99)
Potassium: 3.3 mmol/L — ABNORMAL LOW (ref 3.5–5.1)
Sodium: 140 mmol/L (ref 135–145)

## 2020-08-29 LAB — CBC
HCT: 38.5 % — ABNORMAL LOW (ref 39.0–52.0)
Hemoglobin: 13.1 g/dL (ref 13.0–17.0)
MCH: 32.7 pg (ref 26.0–34.0)
MCHC: 34 g/dL (ref 30.0–36.0)
MCV: 96 fL (ref 80.0–100.0)
Platelets: 199 K/uL (ref 150–400)
RBC: 4.01 MIL/uL — ABNORMAL LOW (ref 4.22–5.81)
RDW: 14.6 % (ref 11.5–15.5)
WBC: 15.4 K/uL — ABNORMAL HIGH (ref 4.0–10.5)
nRBC: 0 % (ref 0.0–0.2)

## 2020-08-29 LAB — MAGNESIUM: Magnesium: 1.9 mg/dL (ref 1.7–2.4)

## 2020-08-29 LAB — TROPONIN I (HIGH SENSITIVITY)
Troponin I (High Sensitivity): 61 ng/L — ABNORMAL HIGH (ref <18)
Troponin I (High Sensitivity): 56 ng/L — ABNORMAL HIGH

## 2020-08-29 MED ORDER — POTASSIUM CHLORIDE CRYS ER 20 MEQ PO TBCR
40.0000 meq | EXTENDED_RELEASE_TABLET | Freq: Once | ORAL | Status: AC
  Administered 2020-08-29: 40 meq via ORAL
  Filled 2020-08-29: qty 2

## 2020-08-29 NOTE — Discharge Instructions (Signed)
Information on my medicine - ELIQUIS (apixaban)  This medication education was reviewed with me or my healthcare representative as part of my discharge preparation.  The pharmacist that spoke with me during my hospital stay was:  Brendan Gadson Z  Emmarie Sannes,, RPH  Why was Eliquis prescribed for you? Eliquis was prescribed to treat blood clots that may have been found in the veins of your legs (deep vein thrombosis) or in your lungs (pulmonary embolism) and to reduce the risk of them occurring again.  What do You need to know about Eliquis ? The starting dose is 10 mg (two 5 mg tablets) taken TWICE daily for the FIRST SEVEN (7) DAYS, then on  09/05/2020  the dose is reduced to ONE 5 mg tablet taken TWICE daily.  Eliquis may be taken with or without food.   Try to take the dose about the same time in the morning and in the evening. If you have difficulty swallowing the tablet whole please discuss with your pharmacist how to take the medication safely.  Take Eliquis exactly as prescribed and DO NOT stop taking Eliquis without talking to the doctor who prescribed the medication.  Stopping may increase your risk of developing a new blood clot.  Refill your prescription before you run out.  After discharge, you should have regular check-up appointments with your healthcare provider that is prescribing your Eliquis.    What do you do if you miss a dose? If a dose of ELIQUIS is not taken at the scheduled time, take it as soon as possible on the same day and twice-daily administration should be resumed. The dose should not be doubled to make up for a missed dose.  Important Safety Information A possible side effect of Eliquis is bleeding. You should call your healthcare provider right away if you experience any of the following: ? Bleeding from an injury or your nose that does not stop. ? Unusual colored urine (red or dark brown) or unusual colored stools (red or black). ? Unusual bruising for unknown  reasons. ? A serious fall or if you hit your head (even if there is no bleeding).  Some medicines may interact with Eliquis and might increase your risk of bleeding or clotting while on Eliquis. To help avoid this, consult your healthcare provider or pharmacist prior to using any new prescription or non-prescription medications, including herbals, vitamins, non-steroidal anti-inflammatory drugs (NSAIDs) and supplements.  This website has more information on Eliquis (apixaban): http://www.eliquis.com/eliquis/home

## 2020-08-29 NOTE — Progress Notes (Signed)
CSW informed by RN that pt has questions about transportation at D/C. Pt states he would need medical transportation home at time of discharge. He states he has all his DME. CSW explained that TOC can set up PTAR at time of discharge.

## 2020-08-29 NOTE — Progress Notes (Signed)
PT Cancellation Note  Patient Details Name: Mitchell Knapp MRN: [id number] DOB: April 06, 1946   Cancelled Treatment:    Reason Eval/Treat Not Completed: Active bedrest order   Current activity orders are strict bedrest;  Will need activity orders liberalized to proceed with PT eval;   Van Clines, PT  Acute Rehabilitation Services Pager [phone] Office [phone]    [name] 08/29/2020, 8:57 AM

## 2020-08-29 NOTE — Progress Notes (Signed)
PROGRESS NOTE    Mitchell Knapp  ZOX:[id number] DOB: 03/10/1946 DOA: 08/27/2020 PCP: Patient, No Pcp Per   Brief Narrative: 74 year old with past medical history significant for limb -girdle  dystrophy, hypertension, anxiety and depression recent long travel presents with progressive 1 week history of shortness of breath, acutely worsened the morning of admission and had a almost near syncopal.  Patient was initially hypotensive and hypoxic found to have acute PE with evidence of right heart strain.  Hypotension improved with IV fluids.  Patient was admitted to the ICU by PCCM.  He was a started on IV heparin drip.  Patient stabilized on IV heparin.  Plan was to transition to Eliquis on 11/26.  Transfer to triad service 11/26.  -CT angio was positive for extensive bilateral pulmonary emboli with CT evidence of right heart strain.  No saddle embolus at this time.    Assessment & Plan:   Active Problems:   Pulmonary embolism (HCC)  1-Bilateral PE; extensive.  He was treated initially with IV heparin.  Transition to Eliquis today.  ECHO with moderate reduce right ventricular function. Needs repeat ECHO in 3 months , discussed with Dr. Katrinka Blazing.   Still on 3 L oxygen. Monitor continues pulse oxygen.   2-Hypokalemia;  Replete orally.  Mg increase to 1.9  3-Hypomagnesemia; corrected.  4-HTN; norvasc resume.   5-Muscular dystrophy; support care.  6-Leukocytosis; trending down.  UA with 50 WBC.  Received one dose of IV antibiotics.  WBC trending down.  Monitor.   7-ST elevation one lead per telemetry. Patient chest pain free. Repeated EKG similar to admission. troponing repeating and treading down.  Replete potassium.   Estimated body mass index is 27.24 kg/m as calculated from the following:   Height as of this encounter:  (1.93 m).   Weight as of this encounter: 101.5 kg.   DVT prophylaxis: Eliquis  Code Status: Full code Family Communication: Disposition Plan:    Status is: Inpatient  Remains inpatient appropriate because:Ongoing diagnostic testing needed not appropriate for outpatient work up   Dispo: The patient is from: Home              Anticipated d/c is to: to be determine              Anticipated d/c date is: 2 days              Patient currently is not medically stable to d/c.        Consultants:   CCM admitted patient.   Procedures:   ECHO; Left Ventricle: Left ventricular ejection fraction, by estimation, is 50  to 55%. The left ventricle has normal function. The left ventricle has no  regional wall motion abnormalities. The left ventricular internal cavity  size was small. There is no left ventricular hypertrophy. Left ventricular diastolic parameters were  normal.   Right Ventricle: The right ventricular size is mildly enlarged. Right  vetricular wall thickness was not assessed. Right ventricular systolic  function is moderately reduced. There is moderately elevated pulmonary  artery systolic pressure. The tricuspid  regurgitant velocity is 3.43 m/s, and with an assumed right atrial  pressure of 15 mmHg, the estimated right ventricular systolic pressure is  62.1 mmHg.   Antimicrobials:    Subjective: He is breathing ok. Denies chest pain.  His oxygen came off last night, he think he was without oxygen overnight, didn't feel SOB.    Objective: Vitals:   08/28/20 2200 08/28/20 2336 08/28/20 2356 08/29/20 0545  BP: 112/83  (!) 121/91 126/88  Pulse: 72  73 71  Resp: (!) 26  18 18   Temp:  98.1 F (36.7 C) (!) 97.4 F (36.3 C) (!) 97.5 F (36.4 C)  TempSrc:  Oral  Oral  SpO2: 98%  100% 100%  Weight:      Height:        Intake/Output Summary (Last 24 hours) at 08/29/2020 0735 Last data filed at 08/29/2020 [id number] Gross per 24 hour  Intake 366.27 ml  Output 350 ml  Net 16.27 ml   Filed Weights   08/27/20 1415 08/28/20 0500  Weight: 99.8 kg 101.5 kg    Examination:  General exam: Appears calm and  comfortable  Respiratory system: Clear to auscultation. Respiratory effort normal. Cardiovascular system: S1 & S2 heard, RRR. No JVD, murmurs, rubs, gallops or clicks. No pedal edema. Gastrointestinal system: Abdomen is nondistended, soft and nontender. No organomegaly or masses felt. Normal bowel sounds heard. Central nervous system: Alert and oriented. No focal neurological deficits. Extremities: Symmetric 5 x 5 power.   Data Reviewed: I have personally reviewed following labs and imaging studies  CBC: Recent Labs  Lab 08/27/20 1452 08/28/20 0056 08/29/20 0251  WBC 20.2* 18.6* 15.4*  NEUTROABS 17.4*  --   --   HGB 14.4 14.1 13.1  HCT 41.9 41.2 38.5*  MCV 94.4 95.2 96.0  PLT 225 191 199   Basic Metabolic Panel: Recent Labs  Lab 08/27/20 1452 08/27/20 1645 08/27/20 2310 08/28/20 0431 08/29/20 0251  NA 142  --  138 143 140  K 2.2*  --  3.4* 3.0* 3.3*  CL 102  --  101 106 105  CO2 24  --  23 23 22   GLUCOSE 164*  --  101* 98 113*  BUN 23  --  23 23 22   CREATININE 0.84  --  0.66 0.79 0.74  CALCIUM 8.7*  --  8.5* 8.6* 8.2*  MG  --  1.6*  --   --   --    GFR: Estimated Creatinine Clearance: 101 mL/min (by C-G formula based on SCr of 0.74 mg/dL). Liver Function Tests: Recent Labs  Lab 08/27/20 1452  AST 27  ALT 31  ALKPHOS 48  BILITOT 2.2*  PROT 6.5  ALBUMIN 2.7*   No results for input(s): LIPASE, AMYLASE in the last 168 hours. No results for input(s): AMMONIA in the last 168 hours. Coagulation Profile: Recent Labs  Lab 08/27/20 1524  INR 1.2   Cardiac Enzymes: Recent Labs  Lab 08/27/20 1452  CKTOTAL 80   BNP (last 3 results) No results for input(s): PROBNP in the last 8760 hours. HbA1C: No results for input(s): HGBA1C in the last 72 hours. CBG: Recent Labs  Lab 08/27/20 2338  GLUCAP 90   Lipid Profile: No results for input(s): CHOL, HDL, LDLCALC, TRIG, CHOLHDL, LDLDIRECT in the last 72 hours. Thyroid Function Tests: No results for input(s):  TSH, T4TOTAL, FREET4, T3FREE, THYROIDAB in the last 72 hours. Anemia Panel: No results for input(s): VITAMINB12, FOLATE, FERRITIN, TIBC, IRON, RETICCTPCT in the last 72 hours. Sepsis Labs: Recent Labs  Lab 08/27/20 1452 08/27/20 1801 08/27/20 1836 08/27/20 2211  PROCALCITON  --   --  <0.10  --   LATICACIDVEN 2.4* 2.7*  --  1.6    Recent Results (from the past 240 hour(s))  MRSA PCR Screening     Status: None   Collection Time: 08/27/20  2:24 AM   Specimen: Nasal Mucosa; Nasopharyngeal  Result Value Ref Range  Status   MRSA by PCR NEGATIVE NEGATIVE Final    Comment:        The GeneXpert MRSA Assay (FDA approved for NASAL specimens only), is one component of a comprehensive MRSA colonization surveillance program. It is not intended to diagnose MRSA infection nor to guide or monitor treatment for MRSA infections. Performed at [facility], [address], [place], [place] [address]   Blood culture (routine x 2)     Status: None (Preliminary result)   Collection Time: 08/27/20  2:52 PM   Specimen: BLOOD RIGHT FOREARM  Result Value Ref Range Status   Specimen Description BLOOD RIGHT FOREARM  Final   Special Requests   Final    BOTTLES DRAWN AEROBIC AND ANAEROBIC Blood Culture adequate volume   Culture   Final    NO GROWTH < 24 HOURS Performed at [facility], [address], [place], [place] [address]    Report Status PENDING  Incomplete  Blood culture (routine x 2)     Status: None (Preliminary result)   Collection Time: 08/27/20  2:52 PM   Specimen: BLOOD LEFT HAND  Result Value Ref Range Status   Specimen Description BLOOD LEFT HAND  Final   Special Requests   Final    BOTTLES DRAWN AEROBIC AND ANAEROBIC Blood Culture adequate volume   Culture   Final    NO GROWTH < 24 HOURS Performed at [facility], [address], [place], [place] [address]    Report Status PENDING  Incomplete  Resp Panel by RT-PCR (Flu A&B, Covid) Nasopharyngeal Swab      Status: None   Collection Time: 08/27/20  2:52 PM   Specimen: Nasopharyngeal Swab; Nasopharyngeal(NP) swabs in vial transport medium  Result Value Ref Range Status   SARS Coronavirus 2 by RT PCR NEGATIVE NEGATIVE Final    Comment: (NOTE) SARS-CoV-2 target nucleic acids are NOT DETECTED.  The SARS-CoV-2 RNA is generally detectable in upper respiratory specimens during the acute phase of infection. The lowest concentration of SARS-CoV-2 viral copies this assay can detect is 138 copies/mL. A negative result does not preclude SARS-Cov-2 infection and should not be used as the sole basis for treatment or other patient management decisions. A negative result may occur with  improper specimen collection/handling, submission of specimen other than nasopharyngeal swab, presence of viral mutation(s) within the areas targeted by this assay, and inadequate number of viral copies(<138 copies/mL). A negative result must be combined with clinical observations, patient history, and epidemiological information. The expected result is Negative.  Fact Sheet for Patients:  BloggerCourse.com  Fact Sheet for Healthcare Providers:  SeriousBroker.it  This test is no t yet approved or cleared by the [place] FDA and  has been authorized for detection and/or diagnosis of SARS-CoV-2 by FDA under an Emergency Use Authorization (EUA). This EUA will remain  in effect (meaning this test can be used) for the duration of the COVID-19 declaration under Section 564(b)(1) of the Act, 21 U.S.C.section 360bbb-3(b)(1), unless the authorization is terminated  or revoked sooner.       Influenza A by PCR NEGATIVE NEGATIVE Final   Influenza B by PCR NEGATIVE NEGATIVE Final    Comment: (NOTE) The Xpert Xpress SARS-CoV-2/FLU/RSV plus assay is intended as an aid in the diagnosis of influenza from Nasopharyngeal swab specimens and should not be used as a sole basis  for treatment. Nasal washings and aspirates are unacceptable for Xpert Xpress SARS-CoV-2/FLU/RSV testing.  Fact Sheet for  Patients: BloggerCourse.com  Fact Sheet for Healthcare Providers: SeriousBroker.it  This test is not yet approved or cleared by the [place] FDA and has been authorized for detection and/or diagnosis of SARS-CoV-2 by FDA under an Emergency Use Authorization (EUA). This EUA will remain in effect (meaning this test can be used) for the duration of the COVID-19 declaration under Section 564(b)(1) of the Act, 21 U.S.C. section 360bbb-3(b)(1), unless the authorization is terminated or revoked.  Performed at [facility], [address] [place], [place] [address]          Radiology Studies: CT Angio Chest PE W and/or Wo Contrast  Result Date: 08/27/2020 CLINICAL DATA:  74 year old male with shortness of breath. EXAM: CT ANGIOGRAPHY CHEST WITH CONTRAST TECHNIQUE: Multidetector CT imaging of the chest was performed using the standard protocol during bolus administration of intravenous contrast. Multiplanar CT image reconstructions and MIPs were obtained to evaluate the vascular anatomy. CONTRAST:  75mL OMNIPAQUE IOHEXOL 350 MG/ML SOLN COMPARISON:  Portable chest 1507 hours today. FINDINGS: Cardiovascular: Adequate contrast bolus timing in the pulmonary arterial tree. Extensive bilateral pulmonary artery filling defects. No saddle embolus, but evidence of some contrast stasis in the right main pulmonary artery with unusual mixing artifact there (series 6, image 125). Extensive right upper, right middle and right lower lobe thrombus. Less pronounced left upper lobe, lingula thrombus. Fairly occlusive left lower lobe thrombus. RV / LV ratio is abnormal along with straightening of the interventricular septum (series 6, image 195). Superimposed calcified coronary artery atherosclerosis. Little contrast in the aorta.  Mild Calcified aortic atherosclerosis. No cardiomegaly or pericardial effusion. Mediastinum/Nodes: Negative. No mediastinal lymphadenopathy. There is a small volume of oral contrast in the esophagus which appears unremarkable. Lungs/Pleura: Only subtle small areas of upper lobe ground-glass opacity are identified including peripherally in the right lung apex (series 7, image 13, but also peribronchial in the left upper lobe (image 21). No consolidation. Trace layering left pleural fluid only. No right effusion. Upper Abdomen: Some contrast reflux into the hepatic IVC and hepatic veins. Otherwise negative visible liver, gallbladder, spleen, pancreas, left adrenal gland and bowel in the upper abdomen. Trace oral contrast in the stomach. Musculoskeletal: No acute osseous abnormality identified. Mild scoliosis. Review of the MIP images confirms the above findings. IMPRESSION: 1. Positive for extensive bilateral pulmonary emboli with CT evidence of right heart strain. No saddle embolus at this time. 2. Minimal nonspecific upper lobe pulmonary ground-glass opacity, might be early infarction. Trace left pleural effusion. 3. Calcified coronary artery and Aortic Atherosclerosis (ICD10-I70.0). Critical Value/emergent results were called by telephone at the time of interpretation on 08/27/2020 at 4:48 pm to Dr Gwyneth Sprout in the ED who verbally acknowledged these results. Electronically Signed   By: Odessa Fleming M.D.   On: 08/27/2020 16:47   DG Chest Portable 1 View  Result Date: 08/27/2020 CLINICAL DATA:  75 year old male with cough. EXAM: PORTABLE CHEST 1 VIEW COMPARISON:  None. FINDINGS: Minimal left lung base atelectasis/scarring. No focal consolidation, pleural effusion or pneumothorax. Borderline cardiomegaly. Bilateral hilar prominence, likely related to pulmonary hypertension. There is mild atherosclerotic calcification of the aorta. No acute osseous pathology. IMPRESSION: 1. No acute cardiopulmonary process. 2.  Borderline cardiomegaly. Electronically Signed   By: Elgie Collard M.D.   On: 08/27/2020 15:30   ECHOCARDIOGRAM COMPLETE  Result Date: 08/28/2020    ECHOCARDIOGRAM REPORT   Patient Name:   KOLBE DELMONACO Date of Exam: 08/28/2020 Medical Rec #:  [id number]  Height:       76.0 in Accession #:    [id number]           Weight:       223.8 lb Date of Birth:  [birth year]            BSA:          2.324 m Patient Age:    74 years             BP:           116/83 mmHg Patient Gender: M                    HR:           91 bpm. Exam Location:  Inpatient Procedure: 2D Echo, Cardiac Doppler and Color Doppler Indications:    I26.02 Pulmonary embolus  History:        Patient has no prior history of Echocardiogram examinations.                 Right heart strain and calcified coronary arteries seen on CT.  Sonographer:    Roosvelt Maser RDCS Referring Phys: [phone] PAULA B SIMPSON IMPRESSIONS  1. Left ventricular ejection fraction, by estimation, is 50 to 55%. The left ventricle has normal function. The left ventricle has no regional wall motion abnormalities. Left ventricular diastolic parameters were normal.  2. Right ventricular systolic function is moderately reduced. The right ventricular size is mildly enlarged. There is moderately elevated pulmonary artery systolic pressure. The estimated right ventricular systolic pressure is 62.1 mmHg.  3. Right atrial size was mildly dilated.  4. The mitral valve is normal in structure. No evidence of mitral valve regurgitation.  5. The aortic valve is tricuspid. Aortic valve regurgitation is not visualized. No aortic stenosis is present.  6. The inferior vena cava is dilated in size with <50% respiratory variability, suggesting right atrial pressure of 15 mmHg. FINDINGS  Left Ventricle: Left ventricular ejection fraction, by estimation, is 50 to 55%. The left ventricle has normal function. The left ventricle has no regional wall motion abnormalities. The left ventricular  internal cavity size was small. There is no left ventricular hypertrophy. Left ventricular diastolic parameters were normal. Right Ventricle: The right ventricular size is mildly enlarged. Right vetricular wall thickness was not assessed. Right ventricular systolic function is moderately reduced. There is moderately elevated pulmonary artery systolic pressure. The tricuspid regurgitant velocity is 3.43 m/s, and with an assumed right atrial pressure of 15 mmHg, the estimated right ventricular systolic pressure is 62.1 mmHg. Left Atrium: Left atrial size was normal in size. Right Atrium: Right atrial size was mildly dilated. Pericardium: There is no evidence of pericardial effusion. Mitral Valve: The mitral valve is normal in structure. No evidence of mitral valve regurgitation. Tricuspid Valve: The tricuspid valve is normal in structure. Tricuspid valve regurgitation is mild. Aortic Valve: The aortic valve is tricuspid. Aortic valve regurgitation is not visualized. No aortic stenosis is present. Pulmonic Valve: The pulmonic valve was not well visualized. Pulmonic valve regurgitation is not visualized. Aorta: The aortic root and ascending aorta are structurally normal, with no evidence of dilitation. Venous: The inferior vena cava is dilated in size with less than 50% respiratory variability, suggesting right atrial pressure of 15 mmHg. IAS/Shunts: The interatrial septum was not well visualized.  LEFT VENTRICLE PLAX 2D LVIDd:         3.40 cm     Diastology LVIDs:  2.40 cm     LV e' medial:    9.57 cm/s LV PW:         1.00 cm     LV E/e' medial:  4.7 LV IVS:        1.00 cm     LV e' lateral:   7.29 cm/s LVOT diam:     2.20 cm     LV E/e' lateral: 6.2 LV SV:         60 LV SV Index:   26 LVOT Area:     3.80 cm  LV Volumes (MOD) LV vol d, MOD A4C: 43.3 ml LV vol s, MOD A4C: 21.4 ml LV SV MOD A4C:     43.3 ml RIGHT VENTRICLE            IVC RV Basal diam:  3.90 cm    IVC diam: 2.50 cm RV S prime:     9.46 cm/s TAPSE  (M-mode): 1.3 cm LEFT ATRIUM             Index       RIGHT ATRIUM           Index LA diam:        3.30 cm 1.42 cm/m  RA Area:     21.50 cm LA Vol (A2C):   57.3 ml 24.66 ml/m RA Volume:   72.00 ml  30.99 ml/m LA Vol (A4C):   35.7 ml 15.36 ml/m LA Biplane Vol: 47.0 ml 20.23 ml/m  AORTIC VALVE LVOT Vmax:   118.00 cm/s LVOT Vmean:  77.200 cm/s LVOT VTI:    0.158 m  AORTA Ao Root diam: 3.60 cm Ao Asc diam:  3.40 cm MITRAL VALVE               TRICUSPID VALVE MV Area (PHT): 3.45 cm    TR Peak grad:   47.1 mmHg MV Decel Time: 220 msec    TR Vmax:        343.00 cm/s MV E velocity: 45.40 cm/s MV A velocity: 91.30 cm/s  SHUNTS MV E/A ratio:  0.50        Systemic VTI:  0.16 m                            Systemic Diam: 2.20 cm Epifanio Lesches MD Electronically signed by Epifanio Lesches MD Signature Date/Time: 08/28/2020/8:11:01 PM    Final         Scheduled Meds: . amLODipine  5 mg Oral Daily  . apixaban  10 mg Oral BID   Followed by  . [START ON 09/05/2020] apixaban  5 mg Oral BID  . buPROPion  300 mg Oral Daily  . Chlorhexidine Gluconate Cloth  6 each Topical Q0600  . latanoprost  1 drop Both Eyes QHS  . mouth rinse  15 mL Mouth Rinse BID   Continuous Infusions: . heparin 1,600 Units/hr (08/28/20 2339)     LOS: 2 days    Time spent: 35 minutes.     Alba Cory, MD [facility]   If 7PM-7AM, please contact night-coverage [email or web address]  08/29/2020, 7:35 AM

## 2020-08-29 NOTE — Progress Notes (Signed)
Bilateral lower extremity venous duplex completed. Refer to "CV Proc" under chart review to view preliminary results.  08/29/2020 3:52 PM Eula Fried., MHA, RVT, RDCS, RDMS

## 2020-08-30 DIAGNOSIS — N400 Enlarged prostate without lower urinary tract symptoms: Secondary | ICD-10-CM

## 2020-08-30 DIAGNOSIS — E876 Hypokalemia: Secondary | ICD-10-CM

## 2020-08-30 DIAGNOSIS — I10 Essential (primary) hypertension: Secondary | ICD-10-CM

## 2020-08-30 DIAGNOSIS — I2694 Multiple subsegmental pulmonary emboli without acute cor pulmonale: Secondary | ICD-10-CM | POA: Diagnosis not present

## 2020-08-30 DIAGNOSIS — G71039 Limb girdle muscular dystrophy, unspecified: Secondary | ICD-10-CM

## 2020-08-30 LAB — BASIC METABOLIC PANEL
Anion gap: 8 (ref 5–15)
BUN: 18 mg/dL (ref 8–23)
CO2: 25 mmol/L (ref 22–32)
Calcium: 7.9 mg/dL — ABNORMAL LOW (ref 8.9–10.3)
Chloride: 108 mmol/L (ref 98–111)
Creatinine, Ser: 0.62 mg/dL (ref 0.61–1.24)
GFR, Estimated: >60 mL/min (ref >60)
Glucose, Bld: 93 mg/dL (ref 70–99)
Potassium: 4.2 mmol/L (ref 3.5–5.1)
Sodium: 141 mmol/L (ref 135–145)

## 2020-08-30 LAB — CBC
HCT: 37.9 % — ABNORMAL LOW (ref 39.0–52.0)
Hemoglobin: 12.6 g/dL — ABNORMAL LOW (ref 13.0–17.0)
MCH: 32.1 pg (ref 26.0–34.0)
MCHC: 33.2 g/dL (ref 30.0–36.0)
MCV: 96.4 fL (ref 80.0–100.0)
Platelets: 190 10*3/uL (ref 150–400)
RBC: 3.93 MIL/uL — ABNORMAL LOW (ref 4.22–5.81)
RDW: 14.3 % (ref 11.5–15.5)
WBC: 12.4 10*3/uL — ABNORMAL HIGH (ref 4.0–10.5)
nRBC: 0 % (ref 0.0–0.2)

## 2020-08-30 MED ORDER — POLYETHYLENE GLYCOL 3350 17 G PO PACK: 17.0000 g | PACK | Freq: Every day | ORAL | 0 refills | Status: AC | PRN

## 2020-08-30 MED ORDER — DOCUSATE SODIUM 100 MG PO CAPS: 100.0000 mg | ORAL_CAPSULE | Freq: Two times a day (BID) | ORAL | 0 refills | Status: DC | PRN

## 2020-08-30 MED ORDER — TAMSULOSIN HCL 0.4 MG PO CAPS: 0.8000 mg | ORAL_CAPSULE | Freq: Every day | ORAL | 3 refills | Status: AC

## 2020-08-30 MED ORDER — APIXABAN 5 MG PO TABS
5.0000 mg | ORAL_TABLET | Freq: Two times a day (BID) | ORAL | 4 refills | Status: DC
Start: 2020-09-05 — End: 2021-11-02

## 2020-08-30 MED ORDER — LORATADINE 10 MG PO TABS: 10.0000 mg | ORAL_TABLET | Freq: Every day | ORAL | Status: DC

## 2020-08-30 MED ORDER — AMLODIPINE BESYLATE 5 MG PO TABS: 5.0000 mg | ORAL_TABLET | Freq: Every day | ORAL | 2 refills | Status: AC

## 2020-08-30 MED ORDER — APIXABAN 5 MG PO TABS: 10.0000 mg | ORAL_TABLET | Freq: Two times a day (BID) | ORAL | 0 refills | Status: DC

## 2020-08-30 MED ORDER — ACETAMINOPHEN 325 MG PO TABS: 650.0000 mg | ORAL_TABLET | Freq: Four times a day (QID) | ORAL | 0 refills | Status: AC | PRN

## 2020-08-30 NOTE — TOC Transition Note (Addendum)
Transition of Care [facility]) - CM/SW Discharge Note   Patient Details  Name: Mitchell Knapp MRN: [id number] Date of Birth: 1945/10/10  [facility]) CM/SW Contact:  Luvenia Redden, RN Phone Number: 08/30/2020, 12:29 PM   Clinical Narrative:    [facility] RN spoke with pt today concerning the requested arrangements for [facility] transportation due to pt's disability and weakness. Pt states he will be returning to his friend's home ([address] [place], [place]). Pt confirms he has all his DME at this location including his motor chair. Pt receptive to [facility] for a non emergency transportation. Friend will obtain all prescribed medications. RN inquired on follow up visit for primary provider. Pt does not have a local provider. RN will offer resources on AVS for establishing a provider International aid/development worker and Wellness). Bedside nurse Fulton Mole) aware to alert pt that [facility]AR will be called for pick up at 5:00pm.  TOC will continue to follow for any additional discharge needs.  Final next level of care: Home/Self Care Barriers to Discharge: No Barriers Identified   Patient Goals and CMS Choice Patient states their goals for this hospitalization and ongoing recovery are:: To return to his friend's home for ongoing recovery      Discharge Placement                    Patient and family notified of of transfer: 08/30/20  Discharge Plan and Services                                     Social Determinants of Health (SDOH) Interventions     Readmission Risk Interventions No flowsheet data found.

## 2020-08-30 NOTE — Evaluation (Signed)
Physical Therapy Evaluation Patient Details Name: Mitchell Knapp MRN: [id number] DOB: 1946/08/28 Today's Date: 08/30/2020   History of Present Illness  74 year old admitted with Bil PE with CT evidence of right heart strain.  PMH significant for limb -girdle  dystrophy, hypertension, anxiety and depression.  Clinical Impression  Patient with Muscular dystrophy and has been managing independently with assistive devices and equipment at home.  Without his equipment here, we focused on bed mobility and ensuring his O2 levels were maintained with mobility.  Patient able to sit EOB with no significant change in O2 sats.  Pt required min assist to transfer to and from sitting.  Feel patient has all necessary equipment and appropriate mobility/endurance to return home once medically ready.  PT will sign off.    Follow Up Recommendations No PT follow up    Equipment Recommendations  None recommended by PT    Recommendations for Other Services       Precautions / Restrictions Precautions Precautions: Fall      Mobility  Bed Mobility Overal bed mobility: Needs Assistance Bed Mobility: Supine to Sit;Sit to Supine     Supine to sit: Min assist Sit to supine: Min assist   General bed mobility comments: min assist to lift shoulders off bed and min assist to raise LE back on bed. Sat EOB x 10 min on phone with case management, able to reposition self independently as needed    Transfers                    Ambulation/Gait                Stairs            Wheelchair Mobility    Modified Rankin (Stroke Patients Only)       Balance Overall balance assessment: Modified Independent Sitting-balance support: No upper extremity supported;Feet unsupported Sitting balance-Leahy Scale: Good Sitting balance - Comments: sitting EOB x 10 min with no LOB, able to reposition and take challenges                                     Pertinent  Vitals/Pain Pain Assessment: No/denies pain    Home Living Family/patient expects to be discharged to:: Private residence Living Arrangements: Spouse/significant other               Additional Comments: patient has lots of equipment at home that helps him manage independently in that setting.    Prior Function Level of Independence: Independent with assistive device(s)               Hand Dominance        Extremity/Trunk Assessment                Communication   Communication: No difficulties  Cognition Arousal/Alertness: Awake/alert Behavior During Therapy: WFL for tasks assessed/performed Overall Cognitive Status: Within Functional Limits for tasks assessed                                        General Comments      Exercises     Assessment/Plan    PT Assessment Patent does not need any further PT services  PT Problem List         PT Treatment Interventions  PT Goals (Current goals can be found in the Care Plan section)  Acute Rehab PT Goals PT Goal Formulation: All assessment and education complete, DC therapy    Frequency     Barriers to discharge        Co-evaluation               AM-PAC PT "6 Clicks" Mobility  Outcome Measure Help needed turning from your back to your side while in a flat bed without using bedrails?: None Help needed moving from lying on your back to sitting on the side of a flat bed without using bedrails?: A Little Help needed moving to and from a bed to a chair (including a wheelchair)?: A Little Help needed standing up from a chair using your arms (e.g., wheelchair or bedside chair)?: A Little Help needed to walk in hospital room?: A Lot Help needed climbing 3-5 steps with a railing? : A Lot 6 Click Score: 17    End of Session   Activity Tolerance: Patient tolerated treatment well Patient left: in bed;with call bell/phone within reach   PT Visit Diagnosis: Other abnormalities of  gait and mobility (R26.89)    Time: 9622-2979 PT Time Calculation (min) (ACUTE ONLY): 15 min   Charges:   PT Evaluation $PT Eval Low Complexity: 1 Low          08/30/2020 Margie, PT Acute Rehabilitation Services Pager:  [phone] Office:  [phone]    Olivia Canter 08/30/2020, 12:35 PM

## 2020-08-30 NOTE — Plan of Care (Signed)
Patient for transport to home via ambulance service at 1900 today. Patient in agreement with discharge plan. Discharge plan reviewed with the patient.   Problem: Education: Goal: Knowledge of General Education information will improve Description: Including pain rating scale, medication(s)/side effects and non-pharmacologic comfort measures Outcome: Adequate for Discharge   Problem: Health Behavior/Discharge Planning: Goal: Ability to manage health-related needs will improve Outcome: Adequate for Discharge   Problem: Clinical Measurements: Goal: Ability to maintain clinical measurements within normal limits will improve Outcome: Adequate for Discharge Goal: Will remain free from infection Outcome: Adequate for Discharge Goal: Diagnostic test results will improve Outcome: Adequate for Discharge Goal: Respiratory complications will improve Outcome: Adequate for Discharge Goal: Cardiovascular complication will be avoided Outcome: Adequate for Discharge   Problem: Activity: Goal: Risk for activity intolerance will decrease Outcome: Adequate for Discharge   Problem: Nutrition: Goal: Adequate nutrition will be maintained Outcome: Adequate for Discharge   Problem: Coping: Goal: Level of anxiety will decrease Outcome: Adequate for Discharge   Problem: Elimination: Goal: Will not experience complications related to bowel motility Outcome: Adequate for Discharge Goal: Will not experience complications related to urinary retention Outcome: Adequate for Discharge   Problem: Pain Managment: Goal: General experience of comfort will improve Outcome: Adequate for Discharge   Problem: Safety: Goal: Ability to remain free from injury will improve Outcome: Adequate for Discharge   Problem: Skin Integrity: Goal: Risk for impaired skin integrity will decrease Outcome: Adequate for Discharge

## 2020-08-30 NOTE — Discharge Summary (Signed)
Physician Discharge Summary  Mitchell Knapp FEO:[id number] DOB: Jun 20, 1946 DOA: 08/27/2020  PCP: Patient, No Pcp Per  Admit date: 08/27/2020 Discharge date: 08/30/2020  Admitted From:  Home  Disposition: Home   Recommendations for Outpatient Follow-up:  1. Follow up with PCP in 1-2 weeks 2. Please obtain BMP/CBC in one week 3. Follow up with Pulmonologist in 34-months. Clearance to be able to return home/ [place] 4. Needs ECHO in 3 months.   Home Health: None  Discharge Condition: Stable.  CODE STATUS: Full code Diet recommendation: Heart Healthy  Brief/Interim Summary: 74 year old with past medical history significant for limb -girdle  dystrophy, hypertension, anxiety and depression recent long travel presents with progressive 1 week history of shortness of breath, acutely worsened the morning of admission and had a almost near syncopal.  Patient was initially hypotensive and hypoxic found to have acute PE with evidence of right heart strain.  Hypotension improved with IV fluids.  Patient was admitted to the ICU by PCCM.  He was a started on IV heparin drip.  Patient stabilized on IV heparin.  Plan was to transition to Eliquis on 11/26.  Transfer to triad service 11/26.  -CT angio was positive for extensive bilateral pulmonary emboli with CT evidence of right heart strain.  No saddle embolus at this time.   1-Bilateral PE; extensive.  He was treated initially with IV heparin.  Transition to Eliquis 11/26. Tolerated.  ECHO with moderate reduce right ventricular function. Needs repeat ECHO in 3 months , discussed with Dr. Katrinka Blazing.   He was wean off oxygen. No need for home oxygen. Plan to discharge on eliquis, gave refill for 4 months. He would probably needs 6 month treatment or more.  Follow up with pulmonologist. He might required longer duration of anticoagulation due to decrease mobility.   2-Hypokalemia;  Replaced.  Mg increase to 1.9  3-Hypomagnesemia; corrected.   4-HTN; Continue with Norvasc.   5-Muscular dystrophy; support care.  6-Leukocytosis; trending down.  UA with 50 WBC. Urine no growth to date.  Received one dose of IV antibiotics.  WBC trending down.  Monitor.   7-ST elevation one lead per telemetry. Patient chest pain free. Repeated EKG similar to admission. troponing repeating and treading down.  Replete potassium.  8-BPH; resume flomax.    Discharge Diagnoses:  Active Problems:   Pulmonary embolism (HCC)   Essential hypertension   BPH (benign prostatic hyperplasia)   Muscular dystrophy, limb girdle (HCC)   Hypokalemia   Hypomagnesemia    Discharge Instructions  Discharge Instructions    Diet - low sodium heart healthy   Complete by: As directed    Discharge wound care:   Complete by: As directed    See above   Increase activity slowly   Complete by: As directed      Allergies as of 08/30/2020      Reactions   Lisinopril Swelling      Medication List    STOP taking these medications   Advil Dual Action 125-250 MG Tabs Generic drug: Ibuprofen-Acetaminophen     TAKE these medications   acetaminophen 325 MG tablet Commonly known as: TYLENOL Take 2 tablets (650 mg total) by mouth every 6 (six) hours as needed for mild pain or moderate pain.   amLODipine 5 MG tablet Commonly known as: NORVASC Take 1 tablet (5 mg total) by mouth daily.   apixaban 5 MG Tabs tablet Commonly known as: ELIQUIS Take 2 tablets (10 mg total) by mouth 2 (two) times daily  for 7 days.   apixaban 5 MG Tabs tablet Commonly known as: ELIQUIS Take 1 tablet (5 mg total) by mouth 2 (two) times daily. Start taking on: September 05, 2020   buPROPion 300 MG 24 hr tablet Commonly known as: WELLBUTRIN XL Take 300 mg by mouth daily.   Combigan 0.2-0.5 % ophthalmic solution Generic drug: brimonidine-timolol Place 1 drop into both eyes 2 (two) times daily.   docusate sodium 100 MG capsule Commonly known as: COLACE Take 1 capsule  (100 mg total) by mouth 2 (two) times daily as needed for mild constipation.   loratadine 10 MG tablet Commonly known as: CLARITIN Take 10 mg by mouth daily as needed for allergies.   LORazepam 1 MG tablet Commonly known as: ATIVAN Take 1-1.5 mg by mouth daily as needed for anxiety.   Lumigan 0.01 % Soln Generic drug: bimatoprost Place 1 drop into both eyes at bedtime.   polyethylene glycol 17 g packet Commonly known as: MIRALAX / GLYCOLAX Take 17 g by mouth daily as needed for moderate constipation.   tamsulosin 0.4 MG Caps capsule Commonly known as: FLOMAX Take 2 capsules (0.8 mg total) by mouth daily.            Discharge Care Instructions  (From admission, onward)         Start     Ordered   08/30/20 0000  Discharge wound care:       Comments: See above   08/30/20 1148          Follow-up Information    Lorin Glass, MD Follow up in 4 week(s).   Specialty: Pulmonary Disease Contact information: [address] [place] [address] [phone]        [facility] Follow up.   Why: Please contact to establish a primary care provider for an appointment within the next 7-14 days.  Contact information: [address] [place] [address] [phone]             Allergies  Allergen Reactions  . Lisinopril Swelling    Consultations:  CCM admitted patient.    Procedures/Studies: CT Angio Chest PE W and/or Wo Contrast  Result Date: 08/27/2020 CLINICAL DATA:  74 year old male with shortness of breath. EXAM: CT ANGIOGRAPHY CHEST WITH CONTRAST TECHNIQUE: Multidetector CT imaging of the chest was performed using the standard protocol during bolus administration of intravenous contrast. Multiplanar CT image reconstructions and MIPs were obtained to evaluate the vascular anatomy. CONTRAST:  75mL OMNIPAQUE IOHEXOL 350 MG/ML SOLN COMPARISON:  Portable chest 1507 hours today. FINDINGS: Cardiovascular:  Adequate contrast bolus timing in the pulmonary arterial tree. Extensive bilateral pulmonary artery filling defects. No saddle embolus, but evidence of some contrast stasis in the right main pulmonary artery with unusual mixing artifact there (series 6, image 125). Extensive right upper, right middle and right lower lobe thrombus. Less pronounced left upper lobe, lingula thrombus. Fairly occlusive left lower lobe thrombus. RV / LV ratio is abnormal along with straightening of the interventricular septum (series 6, image 195). Superimposed calcified coronary artery atherosclerosis. Little contrast in the aorta. Mild Calcified aortic atherosclerosis. No cardiomegaly or pericardial effusion. Mediastinum/Nodes: Negative. No mediastinal lymphadenopathy. There is a small volume of oral contrast in the esophagus which appears unremarkable. Lungs/Pleura: Only subtle small areas of upper lobe ground-glass opacity are identified including peripherally in the right lung apex (series 7, image 13, but also peribronchial in the left upper lobe (image 21). No consolidation. Trace  layering left pleural fluid only. No right effusion. Upper Abdomen: Some contrast reflux into the hepatic IVC and hepatic veins. Otherwise negative visible liver, gallbladder, spleen, pancreas, left adrenal gland and bowel in the upper abdomen. Trace oral contrast in the stomach. Musculoskeletal: No acute osseous abnormality identified. Mild scoliosis. Review of the MIP images confirms the above findings. IMPRESSION: 1. Positive for extensive bilateral pulmonary emboli with CT evidence of right heart strain. No saddle embolus at this time. 2. Minimal nonspecific upper lobe pulmonary ground-glass opacity, might be early infarction. Trace left pleural effusion. 3. Calcified coronary artery and Aortic Atherosclerosis (ICD10-I70.0). Critical Value/emergent results were called by telephone at the time of interpretation on 08/27/2020 at 4:48 pm to Dr Gwyneth Sprout in the ED who verbally acknowledged these results. Electronically Signed   By: Odessa Fleming M.D.   On: 08/27/2020 16:47   DG Chest Portable 1 View  Result Date: 08/27/2020 CLINICAL DATA:  74 year old male with cough. EXAM: PORTABLE CHEST 1 VIEW COMPARISON:  None. FINDINGS: Minimal left lung base atelectasis/scarring. No focal consolidation, pleural effusion or pneumothorax. Borderline cardiomegaly. Bilateral hilar prominence, likely related to pulmonary hypertension. There is mild atherosclerotic calcification of the aorta. No acute osseous pathology. IMPRESSION: 1. No acute cardiopulmonary process. 2. Borderline cardiomegaly. Electronically Signed   By: Elgie Collard M.D.   On: 08/27/2020 15:30   ECHOCARDIOGRAM COMPLETE  Result Date: 08/28/2020    ECHOCARDIOGRAM REPORT   Patient Name:   Mitchell Knapp Date of Exam: 08/28/2020 Medical Rec #:  [id number]            Height:       76.0 in Accession #:    [id number]           Weight:       223.8 lb Date of Birth:  1945-12-12            BSA:          2.324 m Patient Age:    74 years             BP:           116/83 mmHg Patient Gender: M                    HR:           91 bpm. Exam Location:  Inpatient Procedure: 2D Echo, Cardiac Doppler and Color Doppler Indications:    I26.02 Pulmonary embolus  History:        Patient has no prior history of Echocardiogram examinations.                 Right heart strain and calcified coronary arteries seen on CT.  Sonographer:    Roosvelt Maser RDCS Referring Phys: [phone] PAULA B SIMPSON IMPRESSIONS  1. Left ventricular ejection fraction, by estimation, is 50 to 55%. The left ventricle has normal function. The left ventricle has no regional wall motion abnormalities. Left ventricular diastolic parameters were normal.  2. Right ventricular systolic function is moderately reduced. The right ventricular size is mildly enlarged. There is moderately elevated pulmonary artery systolic pressure. The estimated right  ventricular systolic pressure is 62.1 mmHg.  3. Right atrial size was mildly dilated.  4. The mitral valve is normal in structure. No evidence of mitral valve regurgitation.  5. The aortic valve is tricuspid. Aortic valve regurgitation is not visualized. No aortic stenosis is present.  6. The inferior vena cava is dilated in size with <50%  respiratory variability, suggesting right atrial pressure of 15 mmHg. FINDINGS  Left Ventricle: Left ventricular ejection fraction, by estimation, is 50 to 55%. The left ventricle has normal function. The left ventricle has no regional wall motion abnormalities. The left ventricular internal cavity size was small. There is no left ventricular hypertrophy. Left ventricular diastolic parameters were normal. Right Ventricle: The right ventricular size is mildly enlarged. Right vetricular wall thickness was not assessed. Right ventricular systolic function is moderately reduced. There is moderately elevated pulmonary artery systolic pressure. The tricuspid regurgitant velocity is 3.43 m/s, and with an assumed right atrial pressure of 15 mmHg, the estimated right ventricular systolic pressure is 62.1 mmHg. Left Atrium: Left atrial size was normal in size. Right Atrium: Right atrial size was mildly dilated. Pericardium: There is no evidence of pericardial effusion. Mitral Valve: The mitral valve is normal in structure. No evidence of mitral valve regurgitation. Tricuspid Valve: The tricuspid valve is normal in structure. Tricuspid valve regurgitation is mild. Aortic Valve: The aortic valve is tricuspid. Aortic valve regurgitation is not visualized. No aortic stenosis is present. Pulmonic Valve: The pulmonic valve was not well visualized. Pulmonic valve regurgitation is not visualized. Aorta: The aortic root and ascending aorta are structurally normal, with no evidence of dilitation. Venous: The inferior vena cava is dilated in size with less than 50% respiratory variability, suggesting  right atrial pressure of 15 mmHg. IAS/Shunts: The interatrial septum was not well visualized.  LEFT VENTRICLE PLAX 2D LVIDd:         3.40 cm     Diastology LVIDs:         2.40 cm     LV e' medial:    9.57 cm/s LV PW:         1.00 cm     LV E/e' medial:  4.7 LV IVS:        1.00 cm     LV e' lateral:   7.29 cm/s LVOT diam:     2.20 cm     LV E/e' lateral: 6.2 LV SV:         60 LV SV Index:   26 LVOT Area:     3.80 cm  LV Volumes (MOD) LV vol d, MOD A4C: 43.3 ml LV vol s, MOD A4C: 21.4 ml LV SV MOD A4C:     43.3 ml RIGHT VENTRICLE            IVC RV Basal diam:  3.90 cm    IVC diam: 2.50 cm RV S prime:     9.46 cm/s TAPSE (M-mode): 1.3 cm LEFT ATRIUM             Index       RIGHT ATRIUM           Index LA diam:        3.30 cm 1.42 cm/m  RA Area:     21.50 cm LA Vol (A2C):   57.3 ml 24.66 ml/m RA Volume:   72.00 ml  30.99 ml/m LA Vol (A4C):   35.7 ml 15.36 ml/m LA Biplane Vol: 47.0 ml 20.23 ml/m  AORTIC VALVE LVOT Vmax:   118.00 cm/s LVOT Vmean:  77.200 cm/s LVOT VTI:    0.158 m  AORTA Ao Root diam: 3.60 cm Ao Asc diam:  3.40 cm MITRAL VALVE               TRICUSPID VALVE MV Area (PHT): 3.45 cm    TR Peak grad:   47.1  mmHg MV Decel Time: 220 msec    TR Vmax:        343.00 cm/s MV E velocity: 45.40 cm/s MV A velocity: 91.30 cm/s  SHUNTS MV E/A ratio:  0.50        Systemic VTI:  0.16 m                            Systemic Diam: 2.20 cm Epifanio Lesches MD Electronically signed by Epifanio Lesches MD Signature Date/Time: 08/28/2020/8:11:01 PM    Final    VAS [place] LOWER EXTREMITY VENOUS (DVT)  Result Date: 08/29/2020  Lower Venous DVT Study Indications: Pulmonary embolism.  Comparison Study: No prior study Performing Technologist: Gertie Fey MHA, RDMS, RVT, RDCS  Examination Guidelines: A complete evaluation includes B-mode imaging, spectral Doppler, color Doppler, and power Doppler as needed of all accessible portions of each vessel. Bilateral testing is considered an integral part of a complete  examination. Limited examinations for reoccurring indications may be performed as noted. The reflux portion of the exam is performed with the patient in reverse Trendelenburg.  +---------+---------------+---------+-----------+----------+-----------------+ RIGHT    CompressibilityPhasicitySpontaneityPropertiesThrombus Aging    +---------+---------------+---------+-----------+----------+-----------------+ CFV      Partial        Yes      Yes                  Age Indeterminate +---------+---------------+---------+-----------+----------+-----------------+ SFJ      Full                                                           +---------+---------------+---------+-----------+----------+-----------------+ FV Prox  Full                                                           +---------+---------------+---------+-----------+----------+-----------------+ FV Mid   Full                                                           +---------+---------------+---------+-----------+----------+-----------------+ FV DistalFull                                                           +---------+---------------+---------+-----------+----------+-----------------+ PFV      Full                                                           +---------+---------------+---------+-----------+----------+-----------------+ POP      Full           Yes      Yes                                    +---------+---------------+---------+-----------+----------+-----------------+  PTV                              Yes                                    +---------+---------------+---------+-----------+----------+-----------------+ PERO                             Yes                                    +---------+---------------+---------+-----------+----------+-----------------+   +---------+---------------+---------+-----------+----------+--------------+ LEFT      CompressibilityPhasicitySpontaneityPropertiesThrombus Aging +---------+---------------+---------+-----------+----------+--------------+ CFV      Full           Yes      Yes                                 +---------+---------------+---------+-----------+----------+--------------+ SFJ      Full                                                        +---------+---------------+---------+-----------+----------+--------------+ FV Prox  Full                                                        +---------+---------------+---------+-----------+----------+--------------+ FV Mid   Full                                                        +---------+---------------+---------+-----------+----------+--------------+ FV DistalFull                                                        +---------+---------------+---------+-----------+----------+--------------+ PFV      Full                                                        +---------+---------------+---------+-----------+----------+--------------+ POP      Full           Yes      Yes                                 +---------+---------------+---------+-----------+----------+--------------+ PTV      Full                                                        +---------+---------------+---------+-----------+----------+--------------+  PERO     Full                                                        +---------+---------------+---------+-----------+----------+--------------+     Summary: RIGHT: - There is no evidence of deep vein thrombosis in the lower extremity.  - No cystic structure found in the popliteal fossa.  LEFT: - There is no evidence of deep vein thrombosis in the lower extremity.  - No cystic structure found in the popliteal fossa.  *See table(s) above for measurements and observations.    Preliminary      Subjective: Breathing better   Discharge Exam: Vitals:   08/30/20 0521 08/30/20  1230  BP: (!) 127/91   Pulse: 69   Resp: 18   Temp: 98.1 F (36.7 C)   SpO2: 100% 90%     General: Pt is alert, awake, not in acute distress Cardiovascular: RRR, S1/S2 +, no rubs, no gallops Respiratory: CTA bilaterally, no wheezing, no rhonchi Abdominal: Soft, NT, ND, bowel sounds + Extremities: no edema, no cyanosis    The results of significant diagnostics from this hospitalization (including imaging, microbiology, ancillary and laboratory) are listed below for reference.     Microbiology: Recent Results (from the past 240 hour(s))  MRSA PCR Screening     Status: None   Collection Time: 08/27/20  2:24 AM   Specimen: Nasal Mucosa; Nasopharyngeal  Result Value Ref Range Status   MRSA by PCR NEGATIVE NEGATIVE Final    Comment:        The GeneXpert MRSA Assay (FDA approved for NASAL specimens only), is one component of a comprehensive MRSA colonization surveillance program. It is not intended to diagnose MRSA infection nor to guide or monitor treatment for MRSA infections. Performed at [facility], [address], [place], [place] [address]   Blood culture (routine x 2)     Status: None (Preliminary result)   Collection Time: 08/27/20  2:52 PM   Specimen: BLOOD RIGHT FOREARM  Result Value Ref Range Status   Specimen Description BLOOD RIGHT FOREARM  Final   Special Requests   Final    BOTTLES DRAWN AEROBIC AND ANAEROBIC Blood Culture adequate volume   Culture   Final    NO GROWTH 3 DAYS Performed at [facility], [address], [place], [place] [address]    Report Status PENDING  Incomplete  Blood culture (routine x 2)     Status: None (Preliminary result)   Collection Time: 08/27/20  2:52 PM   Specimen: BLOOD LEFT HAND  Result Value Ref Range Status   Specimen Description BLOOD LEFT HAND  Final   Special Requests   Final    BOTTLES DRAWN AEROBIC AND ANAEROBIC Blood Culture adequate volume   Culture   Final    NO GROWTH 3 DAYS Performed at  [facility], [address], [place], [place] [address]    Report Status PENDING  Incomplete  Resp Panel by RT-PCR (Flu A&B, Covid) Nasopharyngeal Swab     Status: None   Collection Time: 08/27/20  2:52 PM   Specimen: Nasopharyngeal Swab; Nasopharyngeal(NP) swabs in vial transport medium  Result Value Ref Range Status   SARS Coronavirus 2 by RT PCR NEGATIVE NEGATIVE Final    Comment: (NOTE) SARS-CoV-2 target nucleic acids  are NOT DETECTED.  The SARS-CoV-2 RNA is generally detectable in upper respiratory specimens during the acute phase of infection. The lowest concentration of SARS-CoV-2 viral copies this assay can detect is 138 copies/mL. A negative result does not preclude SARS-Cov-2 infection and should not be used as the sole basis for treatment or other patient management decisions. A negative result may occur with  improper specimen collection/handling, submission of specimen other than nasopharyngeal swab, presence of viral mutation(s) within the areas targeted by this assay, and inadequate number of viral copies(<138 copies/mL). A negative result must be combined with clinical observations, patient history, and epidemiological information. The expected result is Negative.  Fact Sheet for Patients:  BloggerCourse.com  Fact Sheet for Healthcare Providers:  SeriousBroker.it  This test is no t yet approved or cleared by the [place] FDA and  has been authorized for detection and/or diagnosis of SARS-CoV-2 by FDA under an Emergency Use Authorization (EUA). This EUA will remain  in effect (meaning this test can be used) for the duration of the COVID-19 declaration under Section 564(b)(1) of the Act, 21 U.S.C.section 360bbb-3(b)(1), unless the authorization is terminated  or revoked sooner.       Influenza A by PCR NEGATIVE NEGATIVE Final   Influenza B by PCR NEGATIVE NEGATIVE Final    Comment: (NOTE) The Xpert  Xpress SARS-CoV-2/FLU/RSV plus assay is intended as an aid in the diagnosis of influenza from Nasopharyngeal swab specimens and should not be used as a sole basis for treatment. Nasal washings and aspirates are unacceptable for Xpert Xpress SARS-CoV-2/FLU/RSV testing.  Fact Sheet for Patients: BloggerCourse.com  Fact Sheet for Healthcare Providers: SeriousBroker.it  This test is not yet approved or cleared by the [place] FDA and has been authorized for detection and/or diagnosis of SARS-CoV-2 by FDA under an Emergency Use Authorization (EUA). This EUA will remain in effect (meaning this test can be used) for the duration of the COVID-19 declaration under Section 564(b)(1) of the Act, 21 U.S.C. section 360bbb-3(b)(1), unless the authorization is terminated or revoked.  Performed at [facility], [address], [place], [place] [address]   Urine culture     Status: None   Collection Time: 08/27/20  3:24 PM   Specimen: In/Out Cath Urine  Result Value Ref Range Status   Specimen Description IN/OUT CATH URINE  Final   Special Requests NONE  Final   Culture   Final    NO GROWTH Performed at [facility], [address], [place], [place] [address]    Report Status 08/29/2020 FINAL  Final     Labs: BNP (last 3 results) Recent Labs    08/27/20 1611  BNP 799.5*   Basic Metabolic Panel: Recent Labs  Lab 08/27/20 1452 08/27/20 1645 08/27/20 2310 08/28/20 0431 08/29/20 0251 08/29/20 0945 08/30/20 0135  NA 142  --  138 143 140  --  141  K 2.2*  --  3.4* 3.0* 3.3*  --  4.2  CL 102  --  101 106 105  --  108  CO2 24  --  23 23 22   --  25  GLUCOSE 164*  --  101* 98 113*  --  93  BUN 23  --  23 23 22   --  18  CREATININE 0.84  --  0.66 0.79 0.74  --  0.62  CALCIUM 8.7*  --  8.5* 8.6* 8.2*  --  7.9*  MG  --  1.6*  --   --   --  1.9  --    Liver Function Tests: Recent Labs  Lab 08/27/20 1452  AST 27  ALT  31  ALKPHOS 48  BILITOT 2.2*  PROT 6.5  ALBUMIN 2.7*   No results for input(s): LIPASE, AMYLASE in the last 168 hours. No results for input(s): AMMONIA in the last 168 hours. CBC: Recent Labs  Lab 08/27/20 1452 08/28/20 0056 08/29/20 0251 08/30/20 0135  WBC 20.2* 18.6* 15.4* 12.4*  NEUTROABS 17.4*  --   --   --   HGB 14.4 14.1 13.1 12.6*  HCT 41.9 41.2 38.5* 37.9*  MCV 94.4 95.2 96.0 96.4  PLT 225 191 199 190   Cardiac Enzymes: Recent Labs  Lab 08/27/20 1452  CKTOTAL 80   BNP: Invalid input(s): POCBNP CBG: Recent Labs  Lab 08/27/20 2338  GLUCAP 90   D-Dimer No results for input(s): DDIMER in the last 72 hours. Hgb A1c No results for input(s): HGBA1C in the last 72 hours. Lipid Profile No results for input(s): CHOL, HDL, LDLCALC, TRIG, CHOLHDL, LDLDIRECT in the last 72 hours. Thyroid function studies No results for input(s): TSH, T4TOTAL, T3FREE, THYROIDAB in the last 72 hours.  Invalid input(s): FREET3 Anemia work up No results for input(s): VITAMINB12, FOLATE, FERRITIN, TIBC, IRON, RETICCTPCT in the last 72 hours. Urinalysis    Component Value Date/Time   COLORURINE AMBER (A) 08/27/2020 1557   APPEARANCEUR HAZY (A) 08/27/2020 1557   LABSPEC 1.015 08/27/2020 1557   PHURINE 5.0 08/27/2020 1557   GLUCOSEU NEGATIVE 08/27/2020 1557   HGBUR SMALL (A) 08/27/2020 1557   BILIRUBINUR NEGATIVE 08/27/2020 1557   KETONESUR 5 (A) 08/27/2020 1557   PROTEINUR 30 (A) 08/27/2020 1557   NITRITE NEGATIVE 08/27/2020 1557   LEUKOCYTESUR LARGE (A) 08/27/2020 1557   Sepsis Labs Invalid input(s): PROCALCITONIN,  WBC,  LACTICIDVEN Microbiology Recent Results (from the past 240 hour(s))  MRSA PCR Screening     Status: None   Collection Time: 08/27/20  2:24 AM   Specimen: Nasal Mucosa; Nasopharyngeal  Result Value Ref Range Status   MRSA by PCR NEGATIVE NEGATIVE Final    Comment:        The GeneXpert MRSA Assay (FDA approved for NASAL specimens only), is one component  of a comprehensive MRSA colonization surveillance program. It is not intended to diagnose MRSA infection nor to guide or monitor treatment for MRSA infections. Performed at [facility], [address] [place], [place] [address]   Blood culture (routine x 2)     Status: None (Preliminary result)   Collection Time: 08/27/20  2:52 PM   Specimen: BLOOD RIGHT FOREARM  Result Value Ref Range Status   Specimen Description BLOOD RIGHT FOREARM  Final   Special Requests   Final    BOTTLES DRAWN AEROBIC AND ANAEROBIC Blood Culture adequate volume   Culture   Final    NO GROWTH 3 DAYS Performed at [facility], [address], [place], [place] [address]    Report Status PENDING  Incomplete  Blood culture (routine x 2)     Status: None (Preliminary result)   Collection Time: 08/27/20  2:52 PM   Specimen: BLOOD LEFT HAND  Result Value Ref Range Status   Specimen Description BLOOD LEFT HAND  Final   Special Requests   Final    BOTTLES DRAWN AEROBIC AND ANAEROBIC Blood Culture adequate volume   Culture   Final    NO GROWTH 3 DAYS Performed at [facility], [address], [place], [place] [address]    Report Status PENDING  Incomplete  Resp Panel by RT-PCR (Flu A&B, Covid) Nasopharyngeal Swab     Status: None   Collection Time: 08/27/20  2:52 PM   Specimen: Nasopharyngeal Swab; Nasopharyngeal(NP) swabs in vial transport medium  Result Value Ref Range Status   SARS Coronavirus 2 by RT PCR NEGATIVE NEGATIVE Final    Comment: (NOTE) SARS-CoV-2 target nucleic acids are NOT DETECTED.  The SARS-CoV-2 RNA is generally detectable in upper respiratory specimens during the acute phase of infection. The lowest concentration of SARS-CoV-2 viral copies this assay can detect is 138 copies/mL. A negative result does not preclude SARS-Cov-2 infection and should not be used as the sole basis for treatment or other patient management decisions. A negative result may occur with   improper specimen collection/handling, submission of specimen other than nasopharyngeal swab, presence of viral mutation(s) within the areas targeted by this assay, and inadequate number of viral copies(<138 copies/mL). A negative result must be combined with clinical observations, patient history, and epidemiological information. The expected result is Negative.  Fact Sheet for Patients:  BloggerCourse.com  Fact Sheet for Healthcare Providers:  SeriousBroker.it  This test is no t yet approved or cleared by the [place] FDA and  has been authorized for detection and/or diagnosis of SARS-CoV-2 by FDA under an Emergency Use Authorization (EUA). This EUA will remain  in effect (meaning this test can be used) for the duration of the COVID-19 declaration under Section 564(b)(1) of the Act, 21 U.S.C.section 360bbb-3(b)(1), unless the authorization is terminated  or revoked sooner.       Influenza A by PCR NEGATIVE NEGATIVE Final   Influenza B by PCR NEGATIVE NEGATIVE Final    Comment: (NOTE) The Xpert Xpress SARS-CoV-2/FLU/RSV plus assay is intended as an aid in the diagnosis of influenza from Nasopharyngeal swab specimens and should not be used as a sole basis for treatment. Nasal washings and aspirates are unacceptable for Xpert Xpress SARS-CoV-2/FLU/RSV testing.  Fact Sheet for Patients: BloggerCourse.com  Fact Sheet for Healthcare Providers: SeriousBroker.it  This test is not yet approved or cleared by the [place] FDA and has been authorized for detection and/or diagnosis of SARS-CoV-2 by FDA under an Emergency Use Authorization (EUA). This EUA will remain in effect (meaning this test can be used) for the duration of the COVID-19 declaration under Section 564(b)(1) of the Act, 21 U.S.C. section 360bbb-3(b)(1), unless the authorization is terminated  or revoked.  Performed at [facility], [address], [place], [place] [address]   Urine culture     Status: None   Collection Time: 08/27/20  3:24 PM   Specimen: In/Out Cath Urine  Result Value Ref Range Status   Specimen Description IN/OUT CATH URINE  Final   Special Requests NONE  Final   Culture   Final    NO GROWTH Performed at [facility], [address] [place], [place] [address]    Report Status 08/29/2020 FINAL  Final     Time coordinating discharge: 40 minutes  SIGNED:   Alba Cory, MD  [facility]

## 2020-09-01 LAB — CULTURE, BLOOD (ROUTINE X 2)
Culture: NO GROWTH
Culture: NO GROWTH
Special Requests: ADEQUATE
Special Requests: ADEQUATE

## 2020-09-05 ENCOUNTER — Telehealth: Payer: Self-pay | Admitting: Internal Medicine

## 2020-09-05 NOTE — Telephone Encounter (Signed)
Pt does not follow with our practice, but was seen by PCCM in hospital for a PE.  Per Dr. Michaelle Copas documentation, pt was to be on 10mg  BID of Eliquis X7 days (starting 08/20/2020) then to go down to maintenance dose of 5mg  bid.   Called pharmacy to see if they are running the correct rx- pharmacy states that insurance is covering the correct dosage of 5mg  bid.  Nothing further needed at this time- will close encounter.

## 2020-09-05 NOTE — Telephone Encounter (Signed)
Spoke with pt, advised that I had spoken to his pharmacist around 2:15 today (see other phone note dated today) and that the pharmacist had verified that the rx was ready to be filled.  Pt expressed understanding.  Nothing further needed at this time- will close encounter.

## 2020-09-24 ENCOUNTER — Other Ambulatory Visit: Payer: Self-pay

## 2020-09-24 ENCOUNTER — Encounter: Payer: Self-pay | Admitting: Pulmonary Disease

## 2020-09-24 ENCOUNTER — Ambulatory Visit: Payer: Medicare Other | Admitting: Pulmonary Disease

## 2020-09-24 VITALS — BP 118/82 | HR 62 | Temp 97.4°F | Ht 76.0 in

## 2020-09-24 DIAGNOSIS — I2601 Septic pulmonary embolism with acute cor pulmonale: Secondary | ICD-10-CM

## 2020-09-24 MED ORDER — FUROSEMIDE 20 MG PO TABS: 20.0000 mg | ORAL_TABLET | Freq: Every day | ORAL | 0 refills | Status: DC

## 2020-09-24 NOTE — Patient Instructions (Addendum)
Continue the Eliquis until you are told to stop.  We need to repeat a heart ultrasound no earlier than late February or 3 months after we discovered the blood clot to make sure the stress on the heart is better.   I am sending a referral to [facility] pulmonology to repeat a heart ultrasound at that time. I am optimistic the result will be normal.   Take lasix 20 mg daily for 3 days then stop. This should help the leg swelling.  Return to clinic as needed. If it turns out you are in [place] in February, let me know and we can arrange the test here.

## 2020-10-02 NOTE — Progress Notes (Signed)
@Patient  ID: , male    DOB: 1946/09/05, 74 y.o.   MRN: [id number]  Chief Complaint  Patient presents with  . Hospitalization Follow-up    Was in the hospital 11/24-11/27 for PE. Per patient, he states he has been doing well since being discharged. Denies any increased SOB.     Referring provider: [id number], MD  HPI:   74 year old whom we are seeing in hospital follow up for submassive PE.  Admitted 11/24 with chest pain, SOB, CTA with large clot burden bilaterally on my interpretation on review.  Symptoms started after long car ride from 12/24.  CT read reports evidence of right heart strain.  BNP elevated to 800, troponin also elevated.  TTE demonstrated evidence of right heart strain with moderate reduced RV function, elevated RVSP.  He was started on heparin and monitor in the hospital for 2 or 3 days.  Transition to oral anticoagulant.  He has not missed any doses.  Overall he feels okay.  Initial symptoms much better.  Still some mild dyspnea on exertion but this is improving.  He is doing his own PT.  He does note some lower extremity swelling that been present since the hospital.  No syncope or presyncope.  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 No pertinent surgical history. Family History: History reviewed. No pertinent family history of respiratory diseases or blood clots. Social History: Never smoker, fiance lives in [place]2020, his home is in [place]  Questionaires / Pulmonary Flowsheets:   ACT:  No flowsheet data found.  MMRC: No flowsheet data found.  Epworth:  No flowsheet data found.  Tests:   FENO:  No results found for: NITRICOXIDE  PFT: No flowsheet data found.  WALK:  No flowsheet data found.  Imaging: Personally reviewed and as per EMR and discussion in this note  Lab Results: Personally reviewed and as per EMR CBC    Component Value Date/Time   WBC 12.4 (H) 08/30/2020 0135    RBC 3.93 (L) 08/30/2020 0135   HGB 12.6 (L) 08/30/2020 0135   HCT 37.9 (L) 08/30/2020 0135   PLT 190 08/30/2020 0135   MCV 96.4 08/30/2020 0135   MCH 32.1 08/30/2020 0135   MCHC 33.2 08/30/2020 0135   RDW 14.3 08/30/2020 0135   LYMPHSABS 1.1 08/27/2020 1452   MONOABS 1.1 (H) 08/27/2020 1452   EOSABS 0.0 08/27/2020 1452   BASOSABS 0.1 08/27/2020 1452    BMET    Component Value Date/Time   NA 141 08/30/2020 0135   K 4.2 08/30/2020 0135   CL 108 08/30/2020 0135   CO2 25 08/30/2020 0135   GLUCOSE 93 08/30/2020 0135   BUN 18 08/30/2020 0135   CREATININE 0.62 08/30/2020 0135   CALCIUM 7.9 (L) 08/30/2020 0135   GFRNONAA >60 08/30/2020 0135    BNP    Component Value Date/Time   BNP 799.5 (H) 08/27/2020 1611    ProBNP No results found for: PROBNP  Specialty Problems   None     Allergies  Allergen Reactions  . Lisinopril Swelling    Immunization History  Administered Date(s) Administered  . Fluad Quad(high Dose 65+) 08/28/2020  . PFIZER SARS-COV-2 Vaccination 11/12/2019, 12/07/2019    History reviewed. No pertinent past medical history.  Tobacco History: Social History   Tobacco Use  Smoking Status Former Smoker  . Packs/day: 1.00  . Types: Cigarettes  . Quit date: 2007  . Years since quitting: 15.0  Smokeless  Tobacco Never Used   Counseling given: Not Answered   Continue to not smoke  Outpatient Encounter Medications as of 09/24/2020  Medication Sig  . acetaminophen (TYLENOL) 325 MG tablet Take 2 tablets (650 mg total) by mouth every 6 (six) hours as needed for mild pain or moderate pain.  Marland Kitchen amLODipine (NORVASC) 5 MG tablet Take 1 tablet (5 mg total) by mouth daily.  Marland Kitchen apixaban (ELIQUIS) 5 MG TABS tablet Take 1 tablet (5 mg total) by mouth 2 (two) times daily.  Marland Kitchen buPROPion (WELLBUTRIN XL) 300 MG 24 hr tablet Take 300 mg by mouth daily.  . COMBIGAN 0.2-0.5 % ophthalmic solution Place 1 drop into both eyes 2 (two) times daily.   Marland Kitchen docusate sodium  (COLACE) 100 MG capsule Take 1 capsule (100 mg total) by mouth 2 (two) times daily as needed for mild constipation.  Marland Kitchen loratadine (CLARITIN) 10 MG tablet Take 10 mg by mouth daily as needed for allergies.  Marland Kitchen LORazepam (ATIVAN) 1 MG tablet Take 1-1.5 mg by mouth daily as needed for anxiety.   Marland Kitchen LUMIGAN 0.01 % SOLN Place 1 drop into both eyes at bedtime.  . polyethylene glycol (MIRALAX / GLYCOLAX) 17 g packet Take 17 g by mouth daily as needed for moderate constipation.  . tamsulosin (FLOMAX) 0.4 MG CAPS capsule Take 2 capsules (0.8 mg total) by mouth daily.  . furosemide (LASIX) 20 MG tablet Take 1 tablet (20 mg total) by mouth daily for 3 days.  . [DISCONTINUED] apixaban (ELIQUIS) 5 MG TABS tablet Take 2 tablets (10 mg total) by mouth 2 (two) times daily for 7 days.   No facility-administered encounter medications on file as of 09/24/2020.     Review of Systems  Review of Systems  No chest pain with exertion. No orthopnea or PND. Comprehensive review of systems otherwise negative.  Physical Exam  BP 118/82   Pulse 62   Temp (!) 97.4 F (36.3 C) (Temporal)   Ht 6\' 4"  (1.93 m)   SpO2 98% Comment: on RA  BMI 27.24 kg/m   Wt Readings from Last 5 Encounters:  08/28/20 223 lb 12.3 oz (101.5 kg)    BMI Readings from Last 5 Encounters:  09/24/20 27.24 kg/m  08/28/20 27.24 kg/m     Physical Exam General: Sitting in motorized scoter, in NAD Eyes: EOMI, no icterus Neck: Supple, no JVP appreciated Pulmonary: Clear aspiration bilaterally, no wheezing or crackle Cardiovascular: Regular rhythm, no murmurs, 1-2+ pitting edema to knees bilaterally. 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  Counseled him on the value of repeating TTE no sooner than 3 months after initial diagnosis to  evaluate for improvement in RV function.  He expressed understanding.  He wishes to move back to [place] and have follow-up there.  Attempted to contact pulmonary office in his hometown but was unsuccessful after many phone calls.  Referral sent to his preferred pulmonary office with request to repeat TTE in February 2022.  If he is in March 2022 visiting his fiance or for any other reason around that time, happy to see him again and order this and follow-up.  He is to continue his oral anticoagulant.  Duration to be discussed and decide upon by his PCP at home.  DOE: Multifactorial, related to recent PE, deconditioning.  Encouraged him to pursue physical therapy but he prefers  to do his own exercises given his underlying limb-girdle dystrophy in his experience not a lot of value for him to see physical therapy.   Return if symptoms worsen or fail to improve.   Karren Burly, MD 10/02/2020   This appointment required 50 minutes of patient care (this includes precharting, chart review, review of results, face-to-face care, etc.).

## 2020-12-12 ENCOUNTER — Ambulatory Visit: Payer: Medicare Other | Admitting: Pulmonary Disease

## 2020-12-12 ENCOUNTER — Other Ambulatory Visit: Payer: Self-pay | Admitting: *Deleted

## 2020-12-12 DIAGNOSIS — I2601 Septic pulmonary embolism with acute cor pulmonale: Secondary | ICD-10-CM

## 2020-12-12 NOTE — Telephone Encounter (Signed)
I have scheduled echo that Dr Judeth Horn ordered to be done prior to ov.  I called pt & gave him appt info and scheduled appt for him to see Dr Judeth Horn.  Nothing further needed.  Will route back to triage so message can be closed.

## 2020-12-16 ENCOUNTER — Other Ambulatory Visit: Payer: Self-pay

## 2020-12-16 ENCOUNTER — Ambulatory Visit (HOSPITAL_COMMUNITY)
Admission: RE | Admit: 2020-12-16 | Discharge: 2020-12-16 | Disposition: A | Discharge status: Home / Self Care | Payer: Medicare Other | Source: Ambulatory Visit | Attending: Pulmonary Disease | Admitting: Pulmonary Disease

## 2020-12-16 DIAGNOSIS — I2601 Septic pulmonary embolism with acute cor pulmonale: Secondary | ICD-10-CM

## 2020-12-16 DIAGNOSIS — I10 Essential (primary) hypertension: Secondary | ICD-10-CM | POA: Insufficient documentation

## 2020-12-16 DIAGNOSIS — G7100 Muscular dystrophy, unspecified: Secondary | ICD-10-CM | POA: Diagnosis not present

## 2020-12-16 DIAGNOSIS — Z993 Dependence on wheelchair: Secondary | ICD-10-CM | POA: Insufficient documentation

## 2020-12-16 DIAGNOSIS — I082 Rheumatic disorders of both aortic and tricuspid valves: Secondary | ICD-10-CM | POA: Diagnosis not present

## 2020-12-16 NOTE — Progress Notes (Signed)
  Echocardiogram 2D Echocardiogram has been performed.  Pieter Partridge 12/16/2020, 10:44 AM

## 2020-12-17 LAB — ECHOCARDIOGRAM COMPLETE: Area-P 1/2: 6.17 cm2

## 2020-12-17 NOTE — Telephone Encounter (Signed)
Received a message from patient that he had received his echo results from yesterday. He stated that he is not currently having any symptoms but is concerned about his results. He wants to know if he needs to be seen sooner.   MH, can you please advise? Thanks!

## 2020-12-24 ENCOUNTER — Ambulatory Visit: Payer: Medicare Other | Admitting: Pulmonary Disease

## 2020-12-24 ENCOUNTER — Other Ambulatory Visit: Payer: Self-pay

## 2020-12-24 ENCOUNTER — Encounter: Payer: Self-pay | Admitting: Pulmonary Disease

## 2020-12-24 VITALS — BP 122/72 | HR 95 | Temp 98.2°F | Ht 76.0 in | Wt 204.4 lb

## 2020-12-24 DIAGNOSIS — R0600 Dyspnea, unspecified: Secondary | ICD-10-CM | POA: Diagnosis not present

## 2020-12-24 DIAGNOSIS — R0609 Other forms of dyspnea: Secondary | ICD-10-CM

## 2020-12-24 DIAGNOSIS — I2609 Other pulmonary embolism with acute cor pulmonale: Secondary | ICD-10-CM

## 2020-12-24 NOTE — Patient Instructions (Signed)
Nice to meet you  The ultrasound looks much better, I think the blood clot has been totally taking care of with the blood thinner and your body healing itself.  Think about Eliquis use in the future.  When you get back to [facility], call us and were happy to see you at any time.

## 2020-12-25 NOTE — Progress Notes (Signed)
@Patient  ID: , male    DOB: June 23, 1946, 75 y.o.   MRN: [id number]  Chief Complaint  Patient presents with  . Follow-up    No complaints currently    Referring provider: [id number], MD  HPI:   75 year old whom we are seeing in follow up for submassive PE.  Patient is doing well.  Increasing exercise at home.  Reports shuffling around on a stool routinely throughout the day.  Ambulates very short distances but relatively none.  Does do multiple transfers today both sitting and standing.  Discussed results of TTE which demonstrates resolved RV dysfunction and normalization of pressures all reassuring for adequate treatment of pulmonary embolus.  Long discussion regarding role of anticoagulation moving forward as discussed below.   HPI initial visit: Admitted 11/24 with chest pain, SOB, CTA with large clot burden bilaterally on my interpretation on review.  Symptoms started after long car ride from 12/24.  CT read reports evidence of right heart strain.  BNP elevated to 800, troponin also elevated.  TTE demonstrated evidence of right heart strain with moderate reduced RV function, elevated RVSP.  He was started on heparin and monitor in the hospital for 2 or 3 days.  Transition to oral anticoagulant.  He has not missed any doses.  Overall he feels okay.  Initial symptoms much better.  Still some mild dyspnea on exertion but this is improving.  He is doing his own PT.  He does note some lower extremity swelling that been present since the hospital.  No syncope or presyncope.  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 No pertinent surgical history. Family History: History reviewed. No pertinent family history of respiratory diseases or blood clots. Social History: Never smoker, fiance lives in [place]2020, his home is in [place]  Questionaires / Pulmonary Flowsheets:   ACT:  No flowsheet data found.  MMRC: No flowsheet data  found.  Epworth:  No flowsheet data found.  Tests:   FENO:  No results found for: NITRICOXIDE  PFT: No flowsheet data found.  WALK:  No flowsheet data found.  Imaging: Personally reviewed and as per EMR and discussion in this note  Lab Results: Personally reviewed and as per EMR CBC    Component Value Date/Time   WBC 12.4 (H) 08/30/2020 0135   RBC 3.93 (L) 08/30/2020 0135   HGB 12.6 (L) 08/30/2020 0135   HCT 37.9 (L) 08/30/2020 0135   PLT 190 08/30/2020 0135   MCV 96.4 08/30/2020 0135   MCH 32.1 08/30/2020 0135   MCHC 33.2 08/30/2020 0135   RDW 14.3 08/30/2020 0135   LYMPHSABS 1.1 08/27/2020 1452   MONOABS 1.1 (H) 08/27/2020 1452   EOSABS 0.0 08/27/2020 1452   BASOSABS 0.1 08/27/2020 1452    BMET    Component Value Date/Time   NA 141 08/30/2020 0135   K 4.2 08/30/2020 0135   CL 108 08/30/2020 0135   CO2 25 08/30/2020 0135   GLUCOSE 93 08/30/2020 0135   BUN 18 08/30/2020 0135   CREATININE 0.62 08/30/2020 0135   CALCIUM 7.9 (L) 08/30/2020 0135   GFRNONAA >60 08/30/2020 0135    BNP    Component Value Date/Time   BNP 799.5 (H) 08/27/2020 1611    ProBNP No results found for: PROBNP  Specialty Problems   None     Allergies  Allergen Reactions  . Lisinopril Swelling    Immunization History  Administered Date(s) Administered  . Fluad Quad(high Dose  65+) 08/28/2020  . Influenza-Unspecified 07/15/2016, 06/20/2017, 09/06/2018  . PFIZER(Purple Top)SARS-COV-2 Vaccination 11/12/2019, 12/07/2019  . Pneumococcal Conjugate-13 07/15/2016  . Pneumococcal Polysaccharide-23 10/17/2017    History reviewed. No pertinent past medical history.  Tobacco History: Social History   Tobacco Use  Smoking Status Former Smoker  . Packs/day: 1.00  . Types: Cigarettes  . Quit date: 2007  . Years since quitting: 15.2  Smokeless Tobacco Never Used   Counseling given: Not Answered   Continue to not smoke  Outpatient Encounter Medications as of 12/24/2020   Medication Sig  . acetaminophen (TYLENOL) 325 MG tablet Take 2 tablets (650 mg total) by mouth every 6 (six) hours as needed for mild pain or moderate pain.  Marland Kitchen amLODipine (NORVASC) 5 MG tablet Take 1 tablet (5 mg total) by mouth daily.  Marland Kitchen apixaban (ELIQUIS) 5 MG TABS tablet Take 1 tablet (5 mg total) by mouth 2 (two) times daily.  Marland Kitchen buPROPion (WELLBUTRIN XL) 300 MG 24 hr tablet Take 300 mg by mouth daily.  . COMBIGAN 0.2-0.5 % ophthalmic solution Place 1 drop into both eyes 2 (two) times daily.   Marland Kitchen docusate sodium (COLACE) 100 MG capsule Take 1 capsule (100 mg total) by mouth 2 (two) times daily as needed for mild constipation.  Marland Kitchen loratadine (CLARITIN) 10 MG tablet Take 10 mg by mouth daily as needed for allergies.  Marland Kitchen LORazepam (ATIVAN) 1 MG tablet Take 1-1.5 mg by mouth daily as needed for anxiety.   Marland Kitchen LUMIGAN 0.01 % SOLN Place 1 drop into both eyes at bedtime.  . polyethylene glycol (MIRALAX / GLYCOLAX) 17 g packet Take 17 g by mouth daily as needed for moderate constipation.  . tamsulosin (FLOMAX) 0.4 MG CAPS capsule Take 2 capsules (0.8 mg total) by mouth daily.  . [DISCONTINUED] furosemide (LASIX) 20 MG tablet Take 1 tablet (20 mg total) by mouth daily for 3 days.   No facility-administered encounter medications on file as of 12/24/2020.     Review of Systems  Review of Systems  n/a  Physical Exam  BP 122/72 (BP Location: Left Arm, Cuff Size: Normal)   Pulse 95   Temp 98.2 F (36.8 C) (Temporal)   Ht 6\' 4"  (1.93 m)   Wt 204 lb 6.4 oz (92.7 kg)   SpO2 98% Comment: Room air  BMI 24.88 kg/m   Wt Readings from Last 5 Encounters:  12/24/20 204 lb 6.4 oz (92.7 kg)  08/28/20 223 lb 12.3 oz (101.5 kg)    BMI Readings from Last 5 Encounters:  12/24/20 24.88 kg/m  09/24/20 27.24 kg/m  08/28/20 27.24 kg/m     Physical Exam General: Sitting in motorized scoter, in NAD Eyes: EOMI, no icterus Neck: Supple, no JVP appreciated Pulmonary: Clear aspiration bilaterally, no  wheezing or crackle Cardiovascular: Regular rhythm, no murmurs Abdomen/GI: Nondistended, nontender Neuro: Mild weakness throughout, sensation intact MSK: No synovitis, no joint effusion Psych: Normal mood, full affect  Assessment & Plan:   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  Repeat TTE with resolution of RV dysfunction, normalization of right-sided pressures.  All reassuring that he has had adequate treatment of his pulmonary embolus.  Long discussion regarding use of anticoagulation moving forward.  Clearly provoked in the setting of long car ride.  He is relatively immobile with his rare form of muscle dystrophy but does extend and flex hip knee and ankle moving around the house multiple times a day despite being minimally ambulatory.  My recommendation is that he use Eliquis  on a prophylactic basis in the setting of long car rides, trips given his relative immobility at baseline.  Would not be unreasonable to continue lifelong anticoagulation but did counsel on the risk of bleeding which admittedly is low and he is hesitant to move forward with this.  Ultimately, recommend he no longer use daily routine anticoagulation for his pulmonary embolus and to consider DVT prophylactic dosing of Eliquis with long trips particularly driving from [place] which he does a few times a year.  DOE: Multifactorial, related to recent PE, deconditioning.  Encouraged him to pursue physical therapy but he prefers to do his own exercises given his underlying limb-girdle dystrophy in his experience not a lot of value for him to see physical therapy.  He is increase his physical activity at home and symptoms are improving.   Return if symptoms worsen or fail to improve.   Karren Burly, MD 12/25/2020   This appointment required 35 minutes of patient care (this includes precharting, chart review, review of results, face-to-face care, etc.).

## 2021-07-22 IMAGING — CT CT ANGIO CHEST
2 of 6 series · 17 of 46 positions shown · IV contrast (omnipaque)
Comparison: Portable chest 3389 hours today.

CLINICAL DATA: 74-year-old male with shortness of breath.

EXAM:
CT ANGIOGRAPHY CHEST WITH CONTRAST
TECHNIQUE: Multidetector CT imaging of the chest was performed using the
standard protocol during bolus administration of intravenous
contrast. Multiplanar CT image reconstructions and MIPs were
obtained to evaluate the vascular anatomy.
CONTRAST:  75mL OMNIPAQUE IOHEXOL 350 MG/ML SOLN

[Series 6: thins · axial · 0.85mm/px · z∈[+1225,+1473]mm · 14 of 272 slices shown]
[im 12/272  lung]
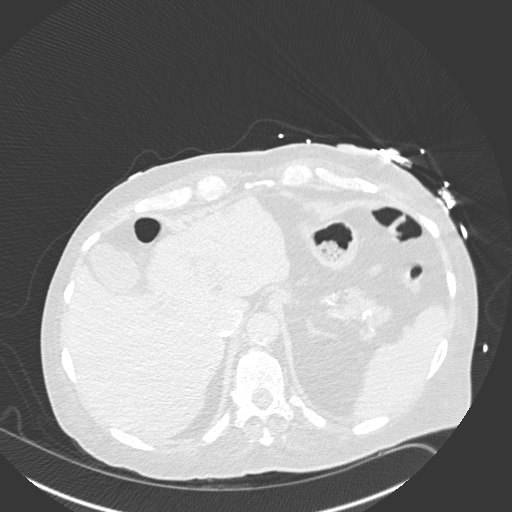
[im 36/272  soft-tissue]
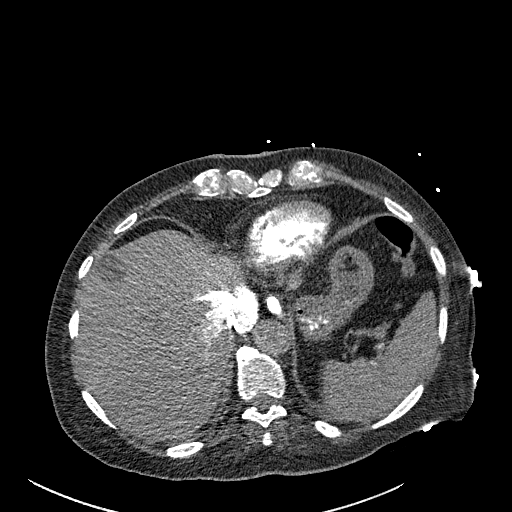
[im 48/272  lung]
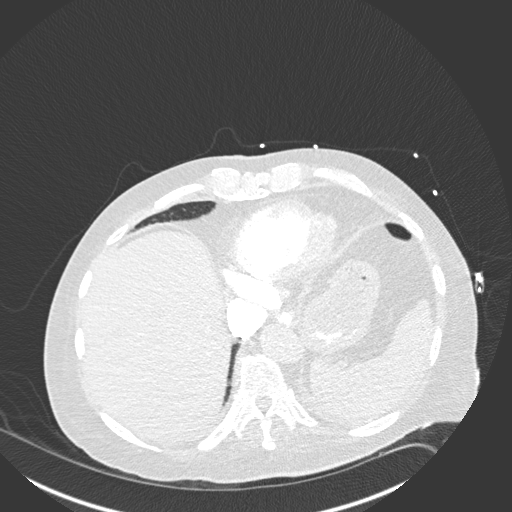
[im 71/272  soft-tissue]
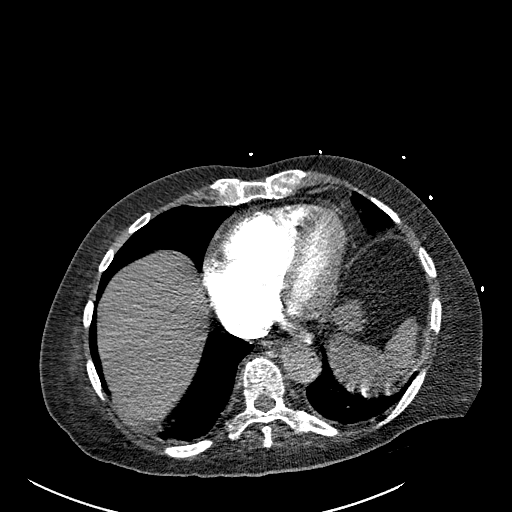
[im 95/272  lung]
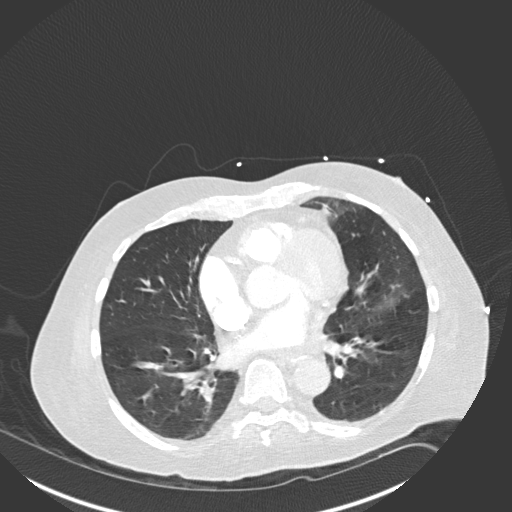
[im 107/272  soft-tissue]
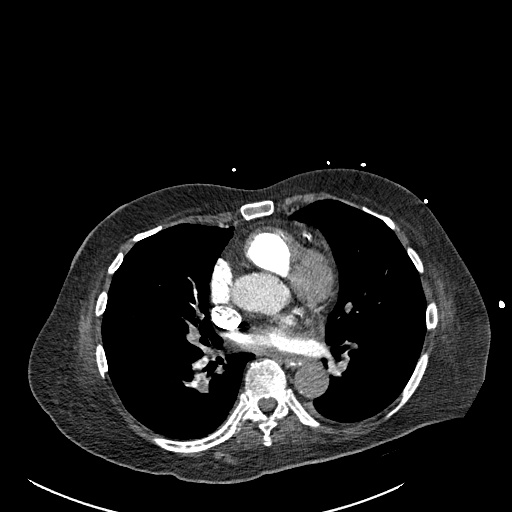
[im 130/272  lung]
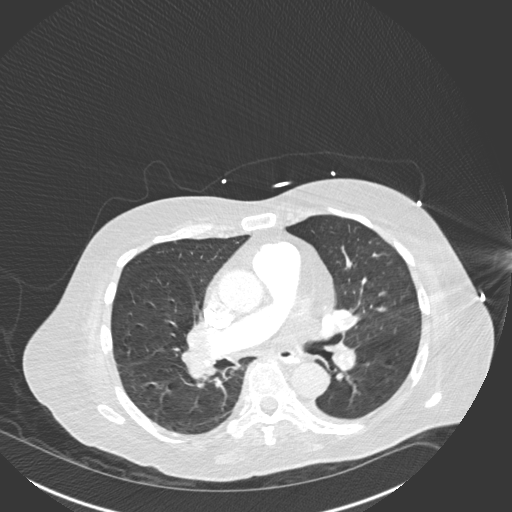
[im 142/272  soft-tissue]
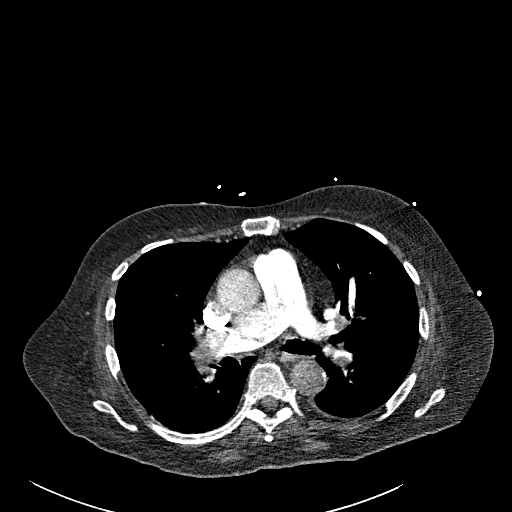
[im 165/272  lung]
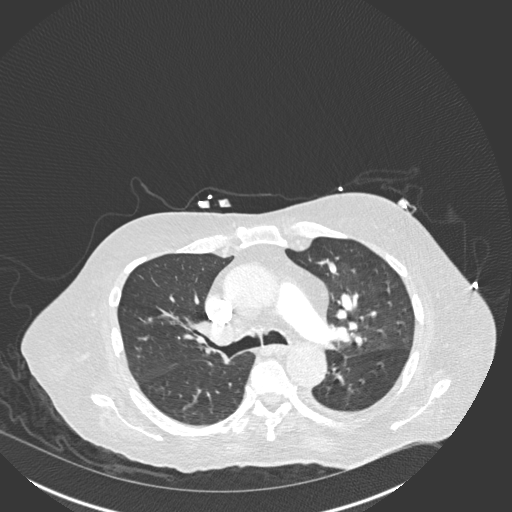
[im 177/272  soft-tissue]
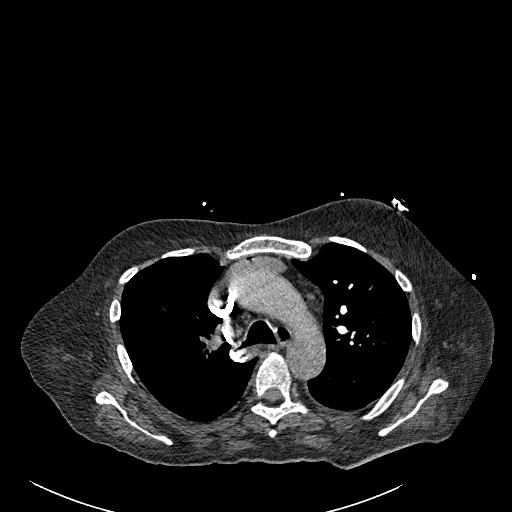
[im 201/272  lung]
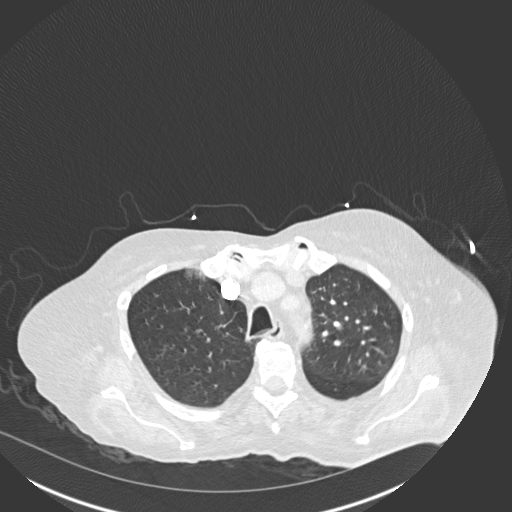
[im 224/272  soft-tissue]
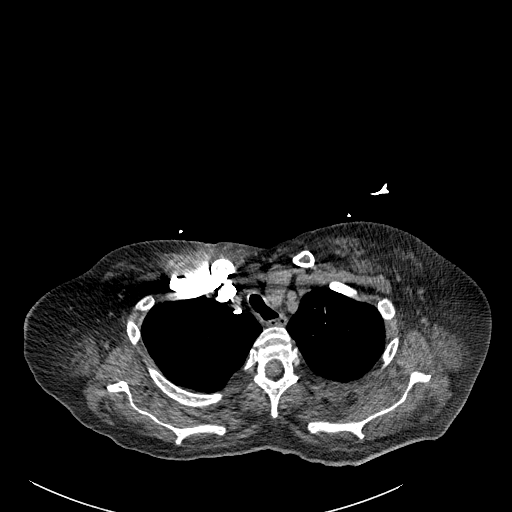
[im 236/272  lung]
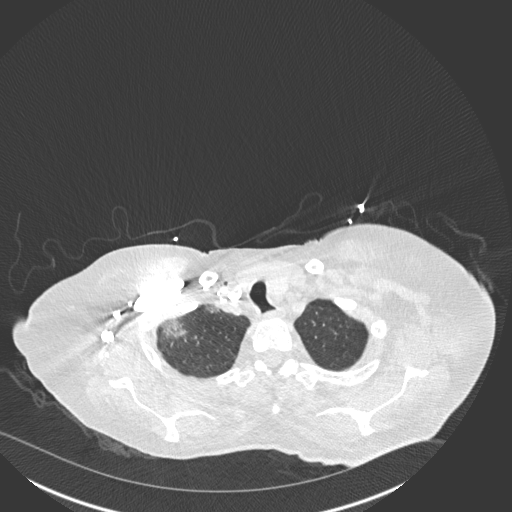
[im 260/272  soft-tissue]
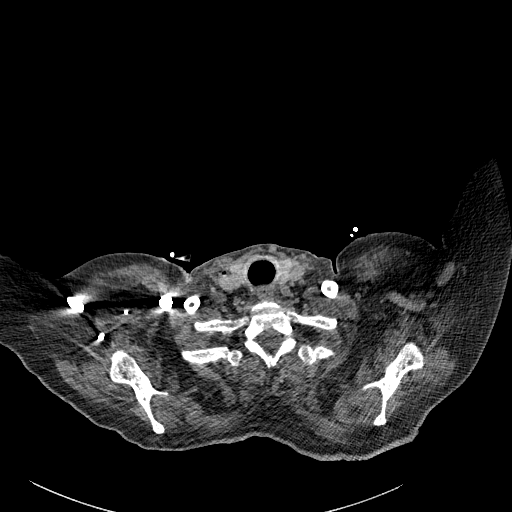

[Series 8: coronal mpr · coronal · 0.59mm/px · 3 of 151 slices shown]
[im 38/151  soft-tissue]
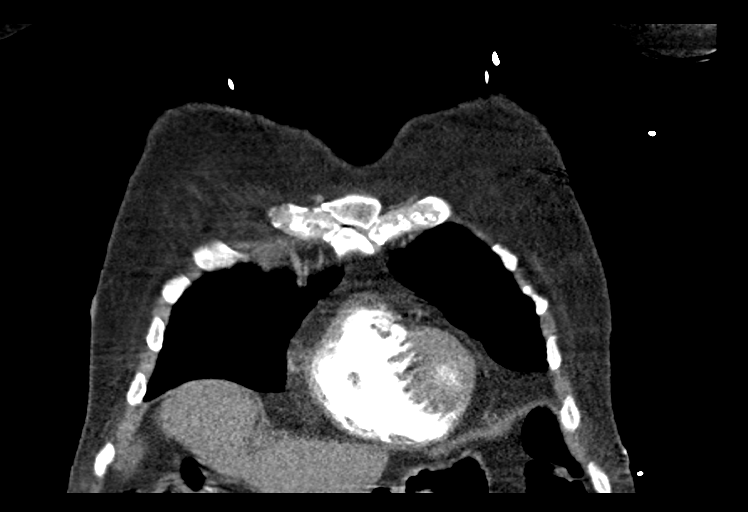
[im 76/151  soft-tissue]
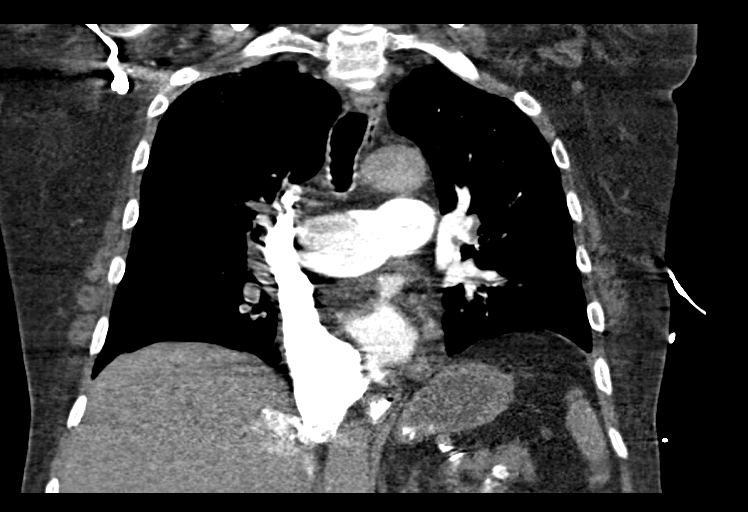
[im 113/151  soft-tissue]
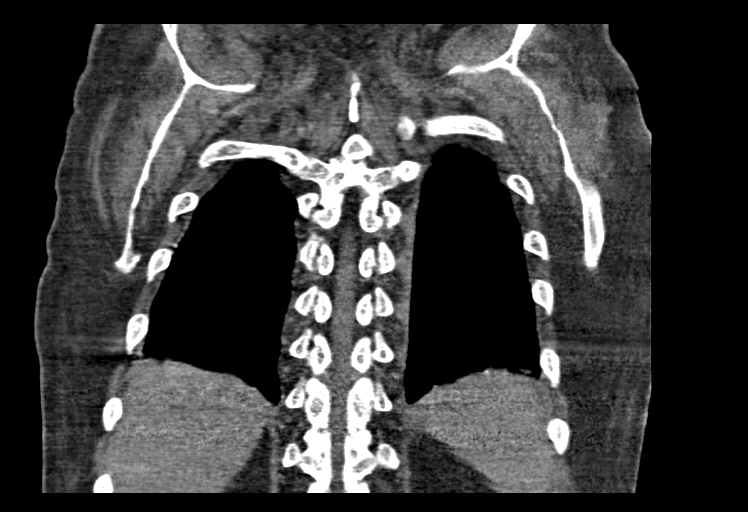

[17 of 46 positions shown; findings below may reference images not displayed]

FINDINGS: Cardiovascular: Adequate contrast bolus timing in the pulmonary
arterial tree. Extensive bilateral pulmonary artery filling defects.
No saddle embolus, but evidence of some contrast stasis in the right
main pulmonary artery with unusual mixing artifact there (series 6,
image 125). Extensive right upper, right middle and right lower lobe
thrombus. Less pronounced left upper lobe, lingula thrombus. Fairly
occlusive left lower lobe thrombus.

RV / LV ratio is abnormal along with straightening of the
interventricular septum (series 6, image 195).

Superimposed calcified coronary artery atherosclerosis. Little
contrast in the aorta. Mild Calcified aortic atherosclerosis. No
cardiomegaly or pericardial effusion.

Mediastinum/Nodes: Negative. No mediastinal lymphadenopathy. There
is a small volume of oral contrast in the esophagus which appears
unremarkable.

Lungs/Pleura: Only subtle small areas of upper lobe ground-glass
opacity are identified including peripherally in the right lung apex
(series 7, image 13, but also peribronchial in the left upper lobe
(image 21). No consolidation. Trace layering left pleural fluid
only. No right effusion.

Upper Abdomen: Some contrast reflux into the hepatic IVC and hepatic
veins. Otherwise negative visible liver, gallbladder, spleen,
pancreas, left adrenal gland and bowel in the upper abdomen. Trace
oral contrast in the stomach.

Musculoskeletal: No acute osseous abnormality identified. Mild
scoliosis.

Review of the MIP images confirms the above findings.
IMPRESSION: 1. Positive for extensive bilateral pulmonary emboli with CT
evidence of right heart strain. No saddle embolus at this time.

2. Minimal nonspecific upper lobe pulmonary ground-glass opacity,
might be early infarction. Trace left pleural effusion.

3. Calcified coronary artery and Aortic Atherosclerosis
([id number]).

Critical Value/emergent results were called by telephone at the time
of interpretation on 08/27/2020 at [DATE] to Dr Milton Domingos Pick in
the ED who verbally acknowledged these results.

## 2021-11-02 ENCOUNTER — Encounter: Payer: Self-pay | Admitting: Internal Medicine

## 2021-11-02 ENCOUNTER — Ambulatory Visit: Payer: Medicare Other | Admitting: Internal Medicine

## 2021-11-02 VITALS — BP 140/70 | HR 68

## 2021-11-02 DIAGNOSIS — K649 Unspecified hemorrhoids: Secondary | ICD-10-CM | POA: Diagnosis not present

## 2021-11-02 DIAGNOSIS — Z1211 Encounter for screening for malignant neoplasm of colon: Secondary | ICD-10-CM | POA: Diagnosis not present

## 2021-11-02 NOTE — Patient Instructions (Signed)
The bleeding is from your hemorrhoids so at this time we've decided not to do a colonoscopy.  Should the blood get darker or if you have stool changes let the Dr know.   I appreciate the opportunity to care for you. Stan Head, MD, [facility]

## 2021-11-02 NOTE — Progress Notes (Signed)
Mitchell Knapp 76 y.o. 21-Jan-1946 [id number]  Assessment & Plan:   Encounter Diagnoses  Name Primary?   Bleeding hemorrhoids Yes   Colon cancer screening      The patient has bleeding hemorrhoids and I am very confident that the blood on the tissue paper is coming from these.  The patient does not think the risk-benefit ratio of colonoscopy for colon cancer screening is in his favor.  I am inclined to agree.  He is comfortable with this and understands that he could have colon polyps or colon cancer even.  He does not desire any interventions for his hemorrhoids.  He will see me as needed and colonoscopy would be reserved for signs and symptoms that warrant its use.   Subjective:   Chief Complaint: Colon cancer screening, chronic rectal bleeding with hemorrhoids question schedule colonoscopy  HPI 76 year old white man with a past medical history of hypertension, PE triggered by long car ride here to discuss possible colonoscopy.  He splits time between [place] and here in his [place] primary care provider has been after him about scheduling a colonoscopy.  Patient has chronic slight bright red blood on the toilet paper from hemorrhoids which he says he knows he has because they protrude.  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  He manages transfers and all of his ADLs himself and does drive.  He understands that with the chronic rectal bleeding a stool based colon cancer screening test would likely be positive.  He is not sure he should have a colonoscopy and in fact is not convinced the risk-benefit ratio is in his favor.  He is here to discuss this.  He is under the impression that a colonoscopy recommendation is "absolute".  He does not have any bowel habit changes significant abdominal pain issues and just this intermittent scanty bright red blood on the toilet paper and prolapsing hemorrhoids are  his only GI complaints.  He tells me he has labs every year and his blood counts are normal.  He had a colonoscopy about 20 years ago and he cannot remember the results though he thinks he might of had some polyps.  He has not pursued a follow-up colonoscopy since at the time of his pulmonary embolism he had a hemoglobin in the 12 range.  He is not on anticoagulation therapy anymore.  It has been recommended that he take Eliquis prophylactically for long trips or other potential situations that could increase the chance of thrombosis. Allergies  Allergen Reactions   Lisinopril Swelling   Current Meds  Medication Sig   acetaminophen (TYLENOL) 325 MG tablet Take 2 tablets (650 mg total) by mouth every 6 (six) hours as needed for mild pain or moderate pain.   amLODipine (NORVASC) 5 MG tablet Take 1 tablet (5 mg total) by mouth daily.   COMBIGAN 0.2-0.5 % ophthalmic solution Place 1 drop into both eyes 2 (two) times daily.    loratadine (CLARITIN) 10 MG tablet Take 10 mg by mouth daily as needed for allergies.   LORazepam (ATIVAN) 1 MG tablet Take 1-1.5 mg by mouth daily as needed for anxiety.    LUMIGAN 0.01 % SOLN Place 1 drop into both eyes at bedtime.   polyethylene glycol (MIRALAX / GLYCOLAX) 17 g packet Take 17 g by mouth daily as needed for moderate constipation.   tamsulosin (FLOMAX) 0.4 MG CAPS capsule Take 2 capsules (0.8 mg total) by  mouth daily.   Past Medical History:  Diagnosis Date   Allergies    Hypertension    Limb-girdle muscular dystrophy    Pulmonary embolism ([place])    History reviewed. No pertinent surgical history. Social History   Social History Narrative   Not on file   family history is not on file.   Review of Systems As per HPI  Objective:   Physical Exam Elderly white man some extremity muscle wasting seen. Lungs clear Heart sounds are normal and distant The abdomen is soft and nontender Rectal exam reveals protruding grade 3 prolapsed internal and  external hemorrhoids that look irritated.  Digital exam demonstrates the same but no mass and brown stool.
